# Patient Record
Sex: Male | Born: 1965 | Race: Black or African American | Hispanic: No | Smoking: Never smoker
Health system: Southern US, Community
[De-identification: ages and names within clinical notes are randomized; demographics above are authoritative.]

## PROBLEM LIST (undated history)

## (undated) DIAGNOSIS — E119 Type 2 diabetes mellitus without complications: Secondary | ICD-10-CM

## (undated) DIAGNOSIS — M79671 Pain in right foot: Secondary | ICD-10-CM

## (undated) DIAGNOSIS — M722 Plantar fascial fibromatosis: Secondary | ICD-10-CM

## (undated) DIAGNOSIS — M5416 Radiculopathy, lumbar region: Secondary | ICD-10-CM

## (undated) DIAGNOSIS — I10 Essential (primary) hypertension: Secondary | ICD-10-CM

## (undated) DIAGNOSIS — E785 Hyperlipidemia, unspecified: Secondary | ICD-10-CM

## (undated) DIAGNOSIS — S14109A Unspecified injury at unspecified level of cervical spinal cord, initial encounter: Secondary | ICD-10-CM

## (undated) DIAGNOSIS — M5412 Radiculopathy, cervical region: Secondary | ICD-10-CM

## (undated) HISTORY — DX: Pain in right foot: M79.671

## (undated) HISTORY — DX: Type 2 diabetes mellitus without complications: E11.9

## (undated) HISTORY — DX: Essential (primary) hypertension: I10

## (undated) HISTORY — DX: Plantar fascial fibromatosis: M72.2

## (undated) HISTORY — DX: Hyperlipidemia, unspecified: E78.5

## (undated) HISTORY — DX: Unspecified injury at unspecified level of cervical spinal cord, initial encounter: S14.109A

## (undated) HISTORY — DX: Radiculopathy, lumbar region: M54.16

## (undated) HISTORY — DX: Radiculopathy, cervical region: M54.12

---

## 2000-10-05 ENCOUNTER — Emergency Department (HOSPITAL_COMMUNITY): Admission: EM | Admit: 2000-10-05 | Discharge: 2000-10-05 | Payer: Self-pay | Admitting: Emergency Medicine

## 2003-05-07 ENCOUNTER — Encounter: Admission: RE | Admit: 2003-05-07 | Discharge: 2003-05-07 | Payer: Self-pay | Admitting: Family Medicine

## 2003-05-31 ENCOUNTER — Encounter: Admission: RE | Admit: 2003-05-31 | Discharge: 2003-05-31 | Payer: Self-pay | Admitting: Family Medicine

## 2004-12-26 ENCOUNTER — Emergency Department (HOSPITAL_COMMUNITY): Admission: EM | Admit: 2004-12-26 | Discharge: 2004-12-27 | Payer: Self-pay | Admitting: Emergency Medicine

## 2012-10-03 ENCOUNTER — Ambulatory Visit: Payer: Self-pay | Admitting: Internal Medicine

## 2012-10-03 VITALS — BP 134/88 | HR 103 | Temp 98.3°F | Resp 18 | Ht 68.5 in | Wt 208.0 lb

## 2012-10-03 DIAGNOSIS — Z0289 Encounter for other administrative examinations: Secondary | ICD-10-CM

## 2012-10-03 NOTE — Progress Notes (Signed)
  Subjective:    Patient ID: Logan Davis, male    DOB: [birth year], 47 y.o.   MRN: [id number]  HPI Healthy non smoker body builder, moves furniture for living. On no meds    Review of Systems neg    Objective:   Physical Exam  Vitals reviewed. Constitutional: He is oriented to person, place, and time. He appears well-developed and well-nourished.  HENT:  Right Ear: External ear normal.  Left Ear: External ear normal.  Nose: Nose normal.  Mouth/Throat: Oropharynx is clear and moist.  Eyes: Conjunctivae and EOM are normal. Pupils are equal, round, and reactive to light.  Neck: Normal range of motion. No thyromegaly present.  Cardiovascular: Normal rate and regular rhythm.   Murmur heard. Pulmonary/Chest: Effort normal and breath sounds normal. He exhibits no tenderness.  Abdominal: Soft. Bowel sounds are normal. There is no tenderness.  Genitourinary: Penis normal.  Musculoskeletal: Normal range of motion. He exhibits no edema and no tenderness.  Lymphadenopathy:    He has no cervical adenopathy.  Neurological: He is alert and oriented to person, place, and time. No cranial nerve deficit. He exhibits normal muscle tone. Coordination normal.  Skin: Skin is warm and dry. No rash noted.  Psychiatric: He has a normal mood and affect. His behavior is normal.    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

## 2012-10-03 NOTE — Patient Instructions (Signed)
Hydrocele, Adult Fluid can collect around the testicles. This fluid forms in a sac. This condition is called a hydrocele. The collected fluid causes swelling of the scrotum. Usually, it affects just one testicle. Most of the time, the condition does not cause pain. Sometimes, the hydrocele goes away on its own. Other times, surgery is needed to get rid of the fluid. CAUSES A hydrocele does not develop often. Different things can cause a hydrocele in a man, including:  Injury to the scrotum.  Infection.  X-ray of the area around the scrotum.  A tumor or cancer of the testicle.  Twisting of a testicle.  Decreased blood flow to the scrotum. SYMPTOMS   Swelling without pain. The hydrocele feels like a water-filled balloon.  Swelling with pain. This can occur if the hydrocele was caused by infection or twisting.  Mild discomfort in the scrotum.  The hydrocele may feel heavy.  Swelling that gets smaller when you lie down. DIAGNOSIS  Your caregiver will do a physical exam to decide if you have a hydrocele. This may include:  Asking questions about your overall health, today and in the past. Your caregiver may ask about any injuries, X-rays, or infections.  Pushing on your abdomen or asking you to change positions to see if the size of the hydrocele changes.  Shining a light through the scrotum (transillumination) to see if the fluid inside the scrotum is clear.  Blood tests and urine tests to check for infection.  Imaging studies that take pictures of the scrotum and testicles. TREATMENT  Treatment depends in part on what caused the condition. Options include:  Watchful waiting. Your caregiver checks the hydrocele every so often.  Different surgeries to drain the fluid.  A needle may be put into the scrotum to drain fluid (needle aspiration). Fluid often returns after this type of treatment.  A cut (incision) may be made in the scrotum to remove the fluid sac  (hydrocelectomy).  An incision may be made in the groin to repair a hydrocele that has contact with abdominal fluids (communicating hydrocele).  Medicines to treat an infection (antibiotics). HOME CARE INSTRUCTIONS  What you need to do at home may depend on the cause of the hydrocele and type of treatment. In general:  Take all medicine as directed by your caregiver. Follow the directions carefully.  Ask your caregiver if there is anything you should not do while you recover (activities, lifting, work, sex).  If you had surgery to repair a communicating hydrocele, recovery time may vary. Ask you caregiver about your recovery time.  Avoid heavy lifting for 4 to 6 weeks.  If you had an incision on the scrotum or groin, wash it for 2 to 3 days after surgery. Do this as long as the skin is closed and there are no gaps in the wound. Wash gently, and avoid rubbing the incision.  Keep all follow-up appointments. SEEK MEDICAL CARE IF:   Your scrotum seems to be getting larger.  The area becomes more and more uncomfortable. SEEK IMMEDIATE MEDICAL CARE IF:  You have a fever. Document Released: 12/02/2009 Document Revised: 09/06/2011 Document Reviewed: 12/02/2009 [facility] Patient Information 2013 [place], [place]. DASH Diet The DASH diet stands for "Dietary Approaches to Stop Hypertension." It is a healthy eating plan that has been shown to reduce high blood pressure (hypertension) in as little as 14 days, while also possibly providing other significant health benefits. These other health benefits include reducing the risk of breast cancer after menopause  and reducing the risk of type 2 diabetes, heart disease, colon cancer, and stroke. Health benefits also include weight loss and slowing kidney failure in patients with chronic kidney disease.  DIET GUIDELINES  Limit salt (sodium). Your diet should contain less than 1500 mg of sodium daily.  Limit refined or processed carbohydrates. Your diet  should include mostly whole grains. Desserts and added sugars should be used sparingly.  Include small amounts of heart-healthy fats. These types of fats include nuts, oils, and tub margarine. Limit saturated and trans fats. These fats have been shown to be harmful in the body. CHOOSING FOODS  The following food groups are based on a 2000 calorie diet. See your Registered Dietitian for individual calorie needs. Grains and Grain Products (6 to 8 servings daily)  Eat More Often: Whole-wheat bread, brown rice, whole-grain or wheat pasta, quinoa, popcorn without added fat or salt (air popped).  Eat Less Often: White bread, white pasta, white rice, cornbread. Vegetables (4 to 5 servings daily)  Eat More Often: Fresh, frozen, and canned vegetables. Vegetables may be raw, steamed, roasted, or grilled with a minimal amount of fat.  Eat Less Often/Avoid: Creamed or fried vegetables. Vegetables in a cheese sauce. Fruit (4 to 5 servings daily)  Eat More Often: All fresh, canned (in natural juice), or frozen fruits. Dried fruits without added sugar. One hundred percent fruit juice ( cup [237 mL] daily).  Eat Less Often: Dried fruits with added sugar. Canned fruit in light or heavy syrup. [employer], Fish, and Poultry (2 servings or less daily. One serving is 3 to 4 oz [85-114 g]).  Eat More Often: Ninety percent or leaner ground beef, tenderloin, sirloin. Round cuts of beef, chicken breast, [place] breast. All fish. Grill, bake, or broil your meat. Nothing should be fried.  Eat Less Often/Avoid: Fatty cuts of meat, [place], or chicken leg, thigh, or wing. Fried cuts of meat or fish. Dairy (2 to 3 servings)  Eat More Often: Low-fat or fat-free milk, low-fat plain or light yogurt, reduced-fat or part-skim cheese.  Eat Less Often/Avoid: Milk (whole, 2%).Whole milk yogurt. Full-fat cheeses. Nuts, Seeds, and Legumes (4 to 5 servings per week)  Eat More Often: All without added salt.  Eat Less  Often/Avoid: Salted nuts and seeds, canned beans with added salt. Fats and Sweets (limited)  Eat More Often: Vegetable oils, tub margarines without trans fats, sugar-free gelatin. Mayonnaise and salad dressings.  Eat Less Often/Avoid: Coconut oils, palm oils, butter, stick margarine, cream, half and half, cookies, candy, pie. FOR MORE INFORMATION The Dash Diet Eating Plan: www.dashdiet.org Document Released: 06/03/2011 Document Revised: 09/06/2011 Document Reviewed: 06/03/2011 [facility] Patient Information 2013 [place], [place]. Hypertension As your heart beats, it forces blood through your arteries. This force is your blood pressure. If the pressure is too high, it is called hypertension (HTN) or high blood pressure. HTN is dangerous because you may have it and not know it. High blood pressure may mean that your heart has to work harder to pump blood. Your arteries may be narrow or stiff. The extra work puts you at risk for heart disease, stroke, and other problems.  Blood pressure consists of two numbers, a higher number over a lower, 110/72, for example. It is stated as "110 over 72." The ideal is below 120 for the top number (systolic) and under 80 for the bottom (diastolic). Write down your blood pressure today. You should pay close attention to your blood pressure if you have certain conditions such as:  Heart failure.  Prior heart attack.  Diabetes  Chronic kidney disease.  Prior stroke.  Multiple risk factors for heart disease. To see if you have HTN, your blood pressure should be measured while you are seated with your arm held at the level of the heart. It should be measured at least twice. A one-time elevated blood pressure reading (especially in the Emergency Department) does not mean that you need treatment. There may be conditions in which the blood pressure is different between your right and left arms. It is important to see your caregiver soon for a recheck. Most people have  essential hypertension which means that there is not a specific cause. This type of high blood pressure may be lowered by changing lifestyle factors such as:  Stress.  Smoking.  Lack of exercise.  Excessive weight.  Drug/tobacco/alcohol use.  Eating less salt. Most people do not have symptoms from high blood pressure until it has caused damage to the body. Effective treatment can often prevent, delay or reduce that damage. TREATMENT  When a cause has been identified, treatment for high blood pressure is directed at the cause. There are a large number of medications to treat HTN. These fall into several categories, and your caregiver will help you select the medicines that are best for you. Medications may have side effects. You should review side effects with your caregiver. If your blood pressure stays high after you have made lifestyle changes or started on medicines,   Your medication(s) may need to be changed.  Other problems may need to be addressed.  Be certain you understand your prescriptions, and know how and when to take your medicine.  Be sure to follow up with your caregiver within the time frame advised (usually within two weeks) to have your blood pressure rechecked and to review your medications.  If you are taking more than one medicine to lower your blood pressure, make sure you know how and at what times they should be taken. Taking two medicines at the same time can result in blood pressure that is too low. SEEK IMMEDIATE MEDICAL CARE IF:  You develop a severe headache, blurred or changing vision, or confusion.  You have unusual weakness or numbness, or a faint feeling.  You have severe chest or abdominal pain, vomiting, or breathing problems. MAKE SURE YOU:   Understand these instructions.  Will watch your condition.  Will get help right away if you are not doing well or get worse. Document Released: 06/14/2005 Document Revised: 09/06/2011 Document  Reviewed: 02/02/2008 [facility] Patient Information 2013 [place], [place].

## 2012-10-14 ENCOUNTER — Encounter: Payer: Self-pay | Admitting: Internal Medicine

## 2013-01-26 NOTE — Progress Notes (Signed)
This encounter was created in error - please disregard.

## 2014-09-30 ENCOUNTER — Encounter: Payer: Self-pay | Admitting: Physician Assistant

## 2014-09-30 NOTE — Progress Notes (Signed)
This encounter was created in error - please disregard.

## 2018-06-26 DIAGNOSIS — N183 Chronic kidney disease, stage 3 (moderate): Secondary | ICD-10-CM | POA: Diagnosis not present

## 2018-06-26 DIAGNOSIS — Z794 Long term (current) use of insulin: Secondary | ICD-10-CM | POA: Diagnosis not present

## 2018-06-26 DIAGNOSIS — I10 Essential (primary) hypertension: Secondary | ICD-10-CM | POA: Diagnosis not present

## 2018-06-26 DIAGNOSIS — E785 Hyperlipidemia, unspecified: Secondary | ICD-10-CM | POA: Diagnosis not present

## 2018-06-26 DIAGNOSIS — I129 Hypertensive chronic kidney disease with stage 1 through stage 4 chronic kidney disease, or unspecified chronic kidney disease: Secondary | ICD-10-CM | POA: Diagnosis not present

## 2018-06-26 DIAGNOSIS — Z23 Encounter for immunization: Secondary | ICD-10-CM | POA: Diagnosis not present

## 2018-06-26 DIAGNOSIS — E1165 Type 2 diabetes mellitus with hyperglycemia: Secondary | ICD-10-CM | POA: Diagnosis not present

## 2018-07-17 ENCOUNTER — Encounter: Payer: Self-pay | Admitting: Endocrinology

## 2018-09-11 ENCOUNTER — Encounter: Payer: Self-pay | Admitting: Internal Medicine

## 2018-09-11 ENCOUNTER — Ambulatory Visit: Payer: BLUE CROSS/BLUE SHIELD | Admitting: Internal Medicine

## 2018-09-11 ENCOUNTER — Other Ambulatory Visit: Payer: Self-pay

## 2018-09-11 VITALS — BP 156/92 | HR 101 | Ht 67.0 in | Wt 206.6 lb

## 2018-09-11 DIAGNOSIS — E1165 Type 2 diabetes mellitus with hyperglycemia: Secondary | ICD-10-CM | POA: Diagnosis not present

## 2018-09-11 DIAGNOSIS — I10 Essential (primary) hypertension: Secondary | ICD-10-CM | POA: Diagnosis not present

## 2018-09-11 DIAGNOSIS — Z1211 Encounter for screening for malignant neoplasm of colon: Secondary | ICD-10-CM | POA: Diagnosis not present

## 2018-09-11 DIAGNOSIS — Z794 Long term (current) use of insulin: Secondary | ICD-10-CM | POA: Diagnosis not present

## 2018-09-11 DIAGNOSIS — R202 Paresthesia of skin: Secondary | ICD-10-CM | POA: Diagnosis not present

## 2018-09-11 LAB — GLUCOSE, POCT (MANUAL RESULT ENTRY): POC Glucose: 178 mg/dl — AB (ref 70–99)

## 2018-09-11 MED ORDER — ONETOUCH VERIO VI STRP: ORAL_STRIP | 6 refills | Status: DC

## 2018-09-11 MED ORDER — METFORMIN HCL ER 500 MG PO TB24: 500.0000 mg | ORAL_TABLET | Freq: Every day | ORAL | 6 refills | Status: DC

## 2018-09-11 NOTE — Progress Notes (Signed)
Name: Logan Davis  MRN/ DOB: [id number], [birth year]   Age/ Sex: 53 y.o., male    PCP: Bryon Lions, PA-C   Reason for Endocrinology Evaluation: Type 2 Diabetes Mellitus     Date of Initial Endocrinology Visit: 09/11/2018     PATIENT IDENTIFIER: Logan Davis is a 53 y.o. male with a past medical history of HTN, Dyslipidemia, and T2DM. The patient presented for initial endocrinology clinic visit on 09/11/2018 for consultative assistance with his diabetes management.    HPI: Logan Davis is here with his wife   Diagnosed with T2DM in 2018 Prior Medications tried/Intolerance: Metformin- intolerance.  Currently checking blood sugars 0 x / day Hypoglycemia episodes : no    Hemoglobin A1c has ranged from 8.7% in 2019, peaking at > 15.5% in 2018. Patient required assistance for hypoglycemia: no Patient has required hospitalization within the last 1 year from hyper or hypoglycemia:  No   In terms of diet, the patient drinks a lot of soft drinks and sugar-sweetened beverages, snacks a lot as well, patient consumes bodybuilding supplements that are high in CHO.    HOME DIABETES REGIMEN: Farxiga 5 mg daily - stopped a month ago  Lantus 14 units daily- skins 3-4 times a week   Victoza 1.8 mg daily     Statin: yes ACE-I/ARB: yes Prior Diabetic Education: no   METER DOWNLOAD SUMMARY: Did not bring    DIABETIC COMPLICATIONS: Microvascular complications:    Denies: neuropathy, retinopathy  Last eye exam: Completed 02/2018  Macrovascular complications:    Denies: CAD, PVD, CVA   PAST HISTORY:  Past Medical History: No past medical history on file.  Past Surgical History: No past surgical history on file.   Social History:  reports that he has never smoked. He has never used smokeless tobacco. He reports that he does not drink alcohol or use drugs. Family History:  Family History  Problem Relation Age of Onset  . Diabetes Mother   . Hypertension Mother   .  Hypertension Father   . Diabetes Father   . Diabetes Sister   . Hypertension Sister   . Hyperlipidemia Sister      HOME MEDICATIONS: Allergies as of 09/11/2018      Reactions   Sulfa Antibiotics Anaphylaxis      Medication List       Accurate as of September 11, 2018 12:39 PM. Always use your most recent med list.        amLODipine 10 MG tablet Commonly known as:  NORVASC Take by mouth.   BD Pen Needle Nano U/F 32G X 4 MM Misc Generic drug:  Insulin Pen Needle USE UTD WITH INSULIN AND VICTOZA D   Lantus SoloStar 100 UNIT/ML Solostar Pen Generic drug:  Insulin Glargine Inject 20 Units into the skin daily.   LIFESCAN FINEPOINT LANCETS Misc Use to check blood sugar 3 time(s) daily   losartan 50 MG tablet Commonly known as:  COZAAR TAKE 1 TABLET(50 MG) BY MOUTH DAILY   metFORMIN 500 MG 24 hr tablet Commonly known as:  GLUCOPHAGE-XR Take 1 tablet (500 mg total) by mouth daily with breakfast.   OneTouch Verio test strip Generic drug:  glucose blood USE UTD TO TEST BS BID   simvastatin 20 MG tablet Commonly known as:  ZOCOR Take by mouth.   Victoza 18 MG/3ML Sopn Generic drug:  liraglutide INJECT 0.6 MG SUBCUTANEOUSLY D FOR 1 WEEK THEN 1.2 MG D FOR 1 WEEK THEN 1.8 MG D  AND CONTINUE        ALLERGIES: Allergies  Allergen Reactions  . Sulfa Antibiotics Anaphylaxis     REVIEW OF SYSTEMS: A comprehensive ROS was conducted with the patient and is negative except as per HPI and below:  Review of Systems  Constitutional: Negative for malaise/fatigue.  HENT: Negative for congestion and sore throat.   Respiratory: Negative for cough and shortness of breath.   Cardiovascular: Negative for chest pain and palpitations.  Genitourinary: Negative for frequency.  Neurological: Positive for tingling. Negative for tremors.  Endo/Heme/Allergies: Negative for polydipsia.  Psychiatric/Behavioral: Negative for depression. The patient is nervous/anxious.       OBJECTIVE:    VITAL SIGNS: BP (!) 156/92 (BP Location: Left Arm, Patient Position: Sitting, Cuff Size: Normal)   Pulse (!) 101   Ht 5\' 7"  (1.702 m)   Wt 206 lb 9.6 oz (93.7 kg)   SpO2 98%   BMI 32.36 kg/m    PHYSICAL EXAM:  General: Pt appears well and is in NAD  Hydration: Well-hydrated with moist mucous membranes and good skin turgor  HEENT: Head: Unremarkable with good dentition. Oropharynx clear without exudate.  Eyes: External eye exam normal without stare, lid lag or exophthalmos.  EOM intact.    Neck: General: Supple without adenopathy or carotid bruits. Thyroid: Thyroid size normal.  No goiter or nodules appreciated. No thyroid bruit.  Lungs: Clear with good BS bilat with no rales, rhonchi, or wheezes  Heart: RRR with normal S1 and S2 and no gallops; no murmurs; no rub  Abdomen: Normoactive bowel sounds, soft, nontender, without masses or organomegaly palpable  Extremities:  Lower extremities - Trace pretibial edema.   Skin: Normal texture and temperature to palpation. No rash noted. No Acanthosis nigricans/skin tags. No lipohypertrophy.  Neuro: MS is good with appropriate affect, pt is alert and Ox3    DM foot exam: deferred   DATA REVIEWED: 06/26/2018  BUN/Cr 16/1.44 mg/dL GFR 65 LDL 497 mg/dL  N3Y 05.1 %        ASSESSMENT / PLAN / RECOMMENDATIONS:   1) Type 2 Diabetes Mellitus, poorly controlled, Without complications - Most recent A1c of 13.0 %. Goal A1c < 7.0 %.    Plan: GENERAL: I have discussed with the patient the pathophysiology of diabetes. We went over the natural progression of the disease. We talked about both insulin resistance and insulin deficiency. We stressed the importance of lifestyle changes including diet and exercise. I explained the complications associated with diabetes including retinopathy, nephropathy, neuropathy as well as increased risk of cardiovascular disease. We went over the benefit seen with glycemic control.  I explained to the patient  that diabetic patients are at higher than normal risk for amputations. The patient was informed that diabetes is the number one cause of non-traumatic amputations in [place].   We have discussed avoiding sugar sweetened beverages, we have discussed finding low-carb alternatives to his supplements, patient under the impression that his fatigue improves with high carb supplements.  His poorly controlled diabetes is due to medication nonadherence and continued dietary indiscretions.  I am wondering if farxiga  is causing fatigue due to the risk of dehydration.  Despite his prior intolerance to metformin, we will restart him on extended release as they have better tolerance, and add a small dose.  We also have discussed the importance of glucose checks, and the importance of having those readings available for proper diabetes management.   MEDICATIONS:  Stop [facility] metformin 500 mg XR  with breakfast  Increase Lantus to 20 units daily  Continue Victoza at 1.8 mg daily  EDUCATION / INSTRUCTIONS:  BG monitoring instructions: Patient is instructed to check his blood sugars 2 times a day, fasting and bedtime.  Call [facility] if: BG persistently < 70 or > 300. . I reviewed the Rule of 15 for the treatment of hypoglycemia in detail with the patient. Literature supplied.   2) Diabetic complications:   Eye: Does not have known diabetic retinopathy.   Neuro/ Feet: Does night have known diabetic peripheral neuropathy.  Renal: Patient does not have known baseline CKD. He is  on an ACEI/ARB at present.   3) Lipids: Patient is  on a statin.  LDL above goal, patient admits to noncompliance with statin use in the past.   4) Hypertension: He is above goal of < 140/90 mmHg.  Will defer further management to PCP  Follow-up in 6 weeks      Signed electronically by: Lyndle Herrlich, MD  [facility]  [facility] [address]  [address] [place], [place] [address] Phone: [phone] FAX: [phone]   CC: Bryon Lions, PA-C [address] [place] [address] Phone: [phone]  Fax: [phone]    Return to Endocrinology clinic as below: Future Appointments  Date Time Provider Department Center  10/23/2018  8:30 AM Kriss Ishler, Konrad Dolores, MD [facility] None

## 2018-09-11 NOTE — Patient Instructions (Signed)
-   Stop Farxiga - Start Metformin 500 mg XR with Breakfast  - Increase Lantus to 20 units daily - Continue Victoza 1.8 mg daily     - Choose healthy, lower carb lower calorie snacks: toss salad, cooked vegetables, cottage cheese, peanut butter, low fat cheese / string cheese, lower sodium deli meat, tuna salad or chicken salad   - Check sugars fasting and bedtime   - HOW TO TREAT LOW BLOOD SUGARS (Blood sugar LESS THAN 70 MG/DL)  Please follow the RULE OF 15 for the treatment of hypoglycemia treatment (when your (blood sugars are less than 70 mg/dL)    STEP 1: Take 15 grams of carbohydrates when your blood sugar is low, which includes:   3-4 GLUCOSE TABS  OR  3-4 OZ OF JUICE OR REGULAR SODA OR  ONE TUBE OF GLUCOSE GEL     STEP 2: RECHECK blood sugar in 15 MINUTES STEP 3: If your blood sugar is still low at the 15 minute recheck --> then, go back to STEP 1 and treat AGAIN with another 15 grams of carbohydrates.

## 2018-09-27 DIAGNOSIS — I10 Essential (primary) hypertension: Secondary | ICD-10-CM | POA: Diagnosis not present

## 2018-09-27 DIAGNOSIS — G540 Brachial plexus disorders: Secondary | ICD-10-CM | POA: Diagnosis not present

## 2018-09-27 DIAGNOSIS — G5711 Meralgia paresthetica, right lower limb: Secondary | ICD-10-CM | POA: Diagnosis not present

## 2018-09-27 DIAGNOSIS — M5412 Radiculopathy, cervical region: Secondary | ICD-10-CM | POA: Diagnosis not present

## 2018-10-18 DIAGNOSIS — E1165 Type 2 diabetes mellitus with hyperglycemia: Secondary | ICD-10-CM | POA: Diagnosis not present

## 2018-10-18 DIAGNOSIS — Z794 Long term (current) use of insulin: Secondary | ICD-10-CM | POA: Diagnosis not present

## 2018-10-23 ENCOUNTER — Ambulatory Visit (INDEPENDENT_AMBULATORY_CARE_PROVIDER_SITE_OTHER): Payer: BLUE CROSS/BLUE SHIELD | Admitting: Internal Medicine

## 2018-10-23 ENCOUNTER — Other Ambulatory Visit: Payer: Self-pay

## 2018-10-23 ENCOUNTER — Encounter: Payer: Self-pay | Admitting: Internal Medicine

## 2018-10-23 DIAGNOSIS — E1165 Type 2 diabetes mellitus with hyperglycemia: Secondary | ICD-10-CM | POA: Diagnosis not present

## 2018-10-23 DIAGNOSIS — I129 Hypertensive chronic kidney disease with stage 1 through stage 4 chronic kidney disease, or unspecified chronic kidney disease: Secondary | ICD-10-CM | POA: Diagnosis not present

## 2018-10-23 DIAGNOSIS — N183 Chronic kidney disease, stage 3 (moderate): Secondary | ICD-10-CM | POA: Diagnosis not present

## 2018-10-23 DIAGNOSIS — E1122 Type 2 diabetes mellitus with diabetic chronic kidney disease: Secondary | ICD-10-CM | POA: Diagnosis not present

## 2018-10-23 DIAGNOSIS — Z794 Long term (current) use of insulin: Secondary | ICD-10-CM

## 2018-10-23 NOTE — Progress Notes (Signed)
Virtual Visit via Video Note  I connected with Pearson Grippe on 10/23/18 at  8:30 AM EDT by a video enabled telemedicine application and verified that I am speaking with the correct person using two identifiers.   I discussed the limitations of evaluation and management by telemedicine and the availability of in person appointments. The patient expressed understanding and agreed to proceed.   -Location of the patient : Home  -Location of the provider : Office -The names of all persons participating in the telemedicine service : Pt and myself , Wife        Name: Logan Davis  Age/ Sex: 53 y.o., male   MRN/ DOB: [id number], 09/11/1965     PCP: Rosemary Holms   Reason for Endocrinology Evaluation: Type 2 Diabetes Mellitus     Initial Endocrinology Clinic Visit: 09/11/2018    PATIENT IDENTIFIER: [name] is a 53 y.o. male with a past medical history of HTN, dyslipidemia and T2DM. The patient has followed with Endocrinology clinic since 09/11/2018 for consultative assistance with management of his diabetes.  DIABETIC HISTORY:  Mr. Brickey was diagnosed with T2DM in  2018. He has reported intolerance to Metformin. He has tried [place] in the past without reported intolerance. Insulin has been started shortly after diagnosis.  His hemoglobin A1c has ranged from 8.7% in 2019, peaking at > 15.5% in 2018.   On his initial visit to our clinic his A1c was 13.0 %. He was on Farxiga (but had stopped it a month prior to his presentation),so we stopped it,  he was skipping lantus ~ 3-4 x a week . He was also on Victoza.  SUBJECTIVE:   During the last visit (09/11/18): His A1c was 13.0 %. He was on Farxiga (but had stopped it a month prior to his presentations. We statred Metformin XR , we increased Lantus and continued Victoza.   Today (10/23/2018): Mr. [name] is here for a 6 week virtual follow up on his diabetes management.  He checks his blood sugars 1 times daily,  preprandial to breakfast . The patient has not had hypoglycemic episodes since the last clinic visit, he did however notice BG's in the 80's and 90's mg/dL. Otherwise, the patient has not required any recent emergency interventions for hypoglycemia and has not had recent hospitalizations secondary to hyper or hypoglycemic episodes.    ROS: As per HPI and as detailed below: Review of Systems  Constitutional: Negative for fever.  HENT: Negative for congestion and sore throat.   Cardiovascular: Negative for chest pain and palpitations.  Gastrointestinal: Negative for diarrhea and nausea.      HOME DIABETES REGIMEN:   Metformin 500 mg XR with breakfast  Lantus to 20 units daily- taking 22 units   Victoza at 1.8 mg daily     GLUCOSE LOG:  date Fasting  10/23/18 130  4/26 122  4/25 102  4/24 125  4/23 106  4/22 118  4/21 116      HISTORY:  Past Medical History:  Past Medical History:  Diagnosis Date  . Diabetes mellitus without complication (HCC)   . Hyperlipidemia   . Hypertension     Past Surgical History: No past surgical history on file.  Social History:  reports that he has never smoked. He has never used smokeless tobacco. He reports that he does not drink alcohol or use drugs. Family History:  Family History  Problem Relation Age of Onset  . Diabetes Mother   .  Hypertension Mother   . Hypertension Father   . Diabetes Father   . Diabetes Sister   . Hypertension Sister   . Hyperlipidemia Sister      HOME MEDICATIONS: Allergies as of 10/23/2018      Reactions   Sulfa Antibiotics Anaphylaxis      Medication List       Accurate as of October 23, 2018  8:40 AM. Always use your most recent med list.        amLODipine 10 MG tablet Commonly known as:  NORVASC Take by mouth.   BD Pen Needle Nano U/F 32G X 4 MM Misc Generic drug:  Insulin Pen Needle USE UTD WITH INSULIN AND VICTOZA D   Lantus SoloStar 100 UNIT/ML Solostar Pen Generic drug:   Insulin Glargine Inject 20 Units into the skin daily.   LIFESCAN FINEPOINT LANCETS Misc Use to check blood sugar 3 time(s) daily   losartan 50 MG tablet Commonly known as:  COZAAR TAKE 1 TABLET(50 MG) BY MOUTH DAILY   metFORMIN 500 MG 24 hr tablet Commonly known as:  GLUCOPHAGE-XR Take 1 tablet (500 mg total) by mouth daily with breakfast.   OneTouch Verio test strip Generic drug:  glucose blood Twice daily   simvastatin 20 MG tablet Commonly known as:  ZOCOR Take by mouth.   Victoza 18 MG/3ML Sopn Generic drug:  liraglutide INJECT 0.6 MG SUBCUTANEOUSLY D FOR 1 WEEK THEN 1.2 MG D FOR 1 WEEK THEN 1.8 MG D AND CONTINUE         DATA REVIEWED:    n/a   ASSESSMENT / PLAN / RECOMMENDATIONS:   1) Type 2 Diabetes Mellitus, Poorly controlled, Without  complications - Most recent A1c of 13.0 %. Goal A1c < 7.0%.   Plan: - Praised the patient on medication adherence and glucose checks.  - I have encouraged him to avoid sugar-sweetened beverages.  - Somehow he was taking 22 units of Lantus instead of the recommended 20 units daily, due to reported BG's in the 80's and 90's with the increase in metformin dose, will reduce lantus dose as below - He is tolerating the XR Metformin dose, will increase as below    MEDICATIONS:  Increase Metformin 500 mg XR to BID with meals  Decrease Lantus to 18 units daily   Continue Victoza 1.8 mg daily   EDUCATION / INSTRUCTIONS:  BG monitoring instructions: Patient is instructed to check his blood sugars 1 times a day, fasting.  Call [facility] clinic if: BG persistently < 70 or > 300. . I reviewed the Rule of 15 for the treatment of hypoglycemia in detail with the patient. Literature supplied.        I discussed the assessment and treatment plan with the patient. The patient was provided an opportunity to ask questions and all were answered. The patient agreed with the plan and demonstrated an understanding of the  instructions.   The patient was advised to call back or seek an in-person evaluation if the symptoms worsen or if the condition fails to improve as anticipated.     F/U in 3 months    Signed electronically by: Lyndle HerrlichAbby Jaralla Kennisha Qin, MD  [facility]  [facility] [address] [place], [place] [address] Phone: [phone] FAX: [phone]   CC: Bryon LionsMoreira, Niall A, PA-C [address] [place] [address] Phone: [phone]  Fax: [phone]  Return to Endocrinology clinic as below: Future Appointments  Date Time Provider  Department Center  11/23/2018  9:00 AM Jobe, Rolin Barry, RD [facility]

## 2018-10-24 DIAGNOSIS — M5412 Radiculopathy, cervical region: Secondary | ICD-10-CM | POA: Diagnosis not present

## 2018-10-24 DIAGNOSIS — G5711 Meralgia paresthetica, right lower limb: Secondary | ICD-10-CM | POA: Diagnosis not present

## 2018-10-24 DIAGNOSIS — G540 Brachial plexus disorders: Secondary | ICD-10-CM | POA: Diagnosis not present

## 2018-11-01 DIAGNOSIS — M5412 Radiculopathy, cervical region: Secondary | ICD-10-CM | POA: Diagnosis not present

## 2018-11-13 DIAGNOSIS — M5412 Radiculopathy, cervical region: Secondary | ICD-10-CM | POA: Diagnosis not present

## 2018-11-13 DIAGNOSIS — G5601 Carpal tunnel syndrome, right upper limb: Secondary | ICD-10-CM | POA: Diagnosis not present

## 2018-11-13 DIAGNOSIS — G5711 Meralgia paresthetica, right lower limb: Secondary | ICD-10-CM | POA: Diagnosis not present

## 2018-11-21 ENCOUNTER — Telehealth: Payer: Self-pay | Admitting: Internal Medicine

## 2018-11-21 ENCOUNTER — Other Ambulatory Visit: Payer: Self-pay | Admitting: Unknown Physician Specialty

## 2018-11-21 DIAGNOSIS — M5412 Radiculopathy, cervical region: Secondary | ICD-10-CM

## 2018-11-21 NOTE — Telephone Encounter (Signed)
Pt stated that he understands instructions

## 2018-11-21 NOTE — Telephone Encounter (Signed)
Pt stated that recent blood sugars have been 11/21/18 163 a.m. 223 @ 10:45 a.m. 11/20/18 153 a.m. 11/19/18 146 a.m.  11/18/18 123 a.m. pt is currently yaking metformin 500 mg once daily, victoza 0.6mg , lantus 20 units and pt was recently placed on HCTZ 12.5 mg, please advise.

## 2018-11-21 NOTE — Telephone Encounter (Signed)
Patient called to advise that he is concerned about his blood sugars.  They have been elevated consistently for the last 5-6 days  Call back number [phone] .

## 2018-11-21 NOTE — Telephone Encounter (Signed)
lft vm for pt to return call to get recent blood sugar readings

## 2018-11-21 NOTE — Telephone Encounter (Signed)
Patient returned call regarding blood sugars

## 2018-11-22 ENCOUNTER — Ambulatory Visit
Admission: RE | Admit: 2018-11-22 | Discharge: 2018-11-22 | Disposition: A | Discharge status: Home / Self Care | Payer: BLUE CROSS/BLUE SHIELD | Source: Ambulatory Visit | Attending: Unknown Physician Specialty | Admitting: Unknown Physician Specialty

## 2018-11-22 DIAGNOSIS — M4802 Spinal stenosis, cervical region: Secondary | ICD-10-CM | POA: Diagnosis not present

## 2018-11-22 DIAGNOSIS — M5412 Radiculopathy, cervical region: Secondary | ICD-10-CM

## 2018-11-22 DIAGNOSIS — M4722 Other spondylosis with radiculopathy, cervical region: Secondary | ICD-10-CM | POA: Diagnosis not present

## 2018-11-23 ENCOUNTER — Ambulatory Visit: Payer: BLUE CROSS/BLUE SHIELD | Admitting: Dietician

## 2018-11-28 ENCOUNTER — Ambulatory Visit
Admission: RE | Admit: 2018-11-28 | Discharge: 2018-11-28 | Disposition: A | Discharge status: Home / Self Care | Payer: BLUE CROSS/BLUE SHIELD | Source: Ambulatory Visit | Attending: Unknown Physician Specialty | Admitting: Unknown Physician Specialty

## 2018-11-28 ENCOUNTER — Other Ambulatory Visit: Payer: Self-pay

## 2018-11-28 ENCOUNTER — Other Ambulatory Visit: Payer: Self-pay | Admitting: Unknown Physician Specialty

## 2018-11-28 DIAGNOSIS — M5412 Radiculopathy, cervical region: Secondary | ICD-10-CM

## 2018-11-28 DIAGNOSIS — M4802 Spinal stenosis, cervical region: Secondary | ICD-10-CM | POA: Diagnosis not present

## 2018-12-04 ENCOUNTER — Other Ambulatory Visit: Payer: Self-pay

## 2018-12-05 ENCOUNTER — Encounter: Payer: Self-pay | Admitting: Internal Medicine

## 2018-12-05 ENCOUNTER — Other Ambulatory Visit: Payer: Self-pay

## 2018-12-05 ENCOUNTER — Ambulatory Visit: Payer: BC Managed Care – PPO | Admitting: Internal Medicine

## 2018-12-05 VITALS — BP 132/94 | HR 96 | Temp 98.4°F | Ht 67.0 in | Wt 206.8 lb

## 2018-12-05 DIAGNOSIS — Z794 Long term (current) use of insulin: Secondary | ICD-10-CM | POA: Diagnosis not present

## 2018-12-05 DIAGNOSIS — E1122 Type 2 diabetes mellitus with diabetic chronic kidney disease: Secondary | ICD-10-CM | POA: Diagnosis not present

## 2018-12-05 DIAGNOSIS — E785 Hyperlipidemia, unspecified: Secondary | ICD-10-CM | POA: Diagnosis not present

## 2018-12-05 DIAGNOSIS — E1165 Type 2 diabetes mellitus with hyperglycemia: Secondary | ICD-10-CM | POA: Diagnosis not present

## 2018-12-05 DIAGNOSIS — I10 Essential (primary) hypertension: Secondary | ICD-10-CM | POA: Diagnosis not present

## 2018-12-05 DIAGNOSIS — E119 Type 2 diabetes mellitus without complications: Secondary | ICD-10-CM | POA: Diagnosis not present

## 2018-12-05 DIAGNOSIS — N183 Chronic kidney disease, stage 3 (moderate): Secondary | ICD-10-CM | POA: Diagnosis not present

## 2018-12-05 LAB — BASIC METABOLIC PANEL
BUN: 19 mg/dL (ref 6–23)
CO2: 29 mEq/L (ref 19–32)
Calcium: 10.2 mg/dL (ref 8.4–10.5)
Chloride: 101 mEq/L (ref 96–112)
Creatinine, Ser: 1.28 mg/dL (ref 0.40–1.50)
GFR: 71.53 mL/min (ref >60.00)
Glucose, Bld: 161 mg/dL — ABNORMAL HIGH (ref 70–99)
Potassium: 3.9 mEq/L (ref 3.5–5.1)
Sodium: 136 mEq/L (ref 135–145)

## 2018-12-05 LAB — LIPID PANEL
Cholesterol: 176 mg/dL (ref 0–200)
HDL: 52.5 mg/dL (ref >39.00)
LDL Cholesterol: 106 mg/dL — ABNORMAL HIGH (ref 0–99)
NonHDL: 123.23
Total CHOL/HDL Ratio: 3
Triglycerides: 87 mg/dL (ref 0.0–149.0)
VLDL: 17.4 mg/dL (ref 0.0–40.0)

## 2018-12-05 LAB — POCT GLYCOSYLATED HEMOGLOBIN (HGB A1C): Hemoglobin A1C: 7.8 % — AB (ref 4.0–5.6)

## 2018-12-05 MED ORDER — ATORVASTATIN CALCIUM 20 MG PO TABS: 20.0000 mg | ORAL_TABLET | Freq: Every day | ORAL | 3 refills | Status: DC

## 2018-12-05 MED ORDER — DAPAGLIFLOZIN PROPANEDIOL 10 MG PO TABS: 10.0000 mg | ORAL_TABLET | Freq: Every day | ORAL | 11 refills | Status: DC

## 2018-12-05 MED ORDER — DULAGLUTIDE 1.5 MG/0.5ML ~~LOC~~ SOAJ
1.5000 mg | SUBCUTANEOUS | 11 refills | Status: DC
Start: 2018-12-05 — End: 2019-03-12

## 2018-12-05 MED ORDER — METFORMIN HCL ER 500 MG PO TB24: 1000.0000 mg | ORAL_TABLET | Freq: Two times a day (BID) | ORAL | 6 refills | Status: DC

## 2018-12-05 NOTE — Progress Notes (Addendum)
Name: Logan Davis Logan Davis  Age/ Sex: 53 y.o., male   MRN/ DOB: [id number], 1965-09-14     PCP: Rosemary HolmsMoreira, Niall A, PA-C   Reason for Endocrinology Evaluation: Type 2 Diabetes Mellitus     Initial Endocrinology Clinic Visit: 09/11/2018    PATIENT IDENTIFIER: [name] Logan Davis is a 53 y.o. male with a past medical history of HTN, dyslipidemia and T2DM. The patient has followed with Endocrinology clinic since 09/11/2018 for consultative assistance with management of his diabetes.  DIABETIC HISTORY:  [name] was diagnosed with T2DM in  2018. He has reported intolerance to Metformin. He has tried [place] in the past without reported intolerance. Insulin has been started shortly after diagnosis.  His hemoglobin A1c has ranged from 8.7% in 2019, peaking at > 15.5% in 2018.   On his initial visit to our clinic his A1c was 13.0 %. He was on Farxiga (but had stopped it a month prior to his presentation),so we stopped it,  he was skipping lantus ~ 3-4 x a week . He was also on Victoza.  SUBJECTIVE:   During the last visit (10/23/18): We increased Metformin, decreased Lantus and continued victoza.  Today (12/05/2018): [name] is here for a 6 week follow up on his diabetes management.  He checks his blood sugars 1 times daily, preprandial to breakfast . The patient has not had hypoglycemic episodes since the last clinic visit. 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 Otherwise, the patient has not required any recent emergency interventions for hypoglycemia and has not had recent hospitalizations secondary to hyper or hypoglycemic episodes.    ROS: As per HPI and as detailed below: Review of Systems  Constitutional: Negative for fever.  HENT: Negative for congestion and sore throat.   Cardiovascular: Negative for chest pain and palpitations.  Gastrointestinal: Negative for diarrhea and nausea.      HOME  DIABETES REGIMEN:   Metformin 500 mg XR 1 tab with breakfast and 1 at night   Lantus 20 units daily   Victoza at 1.8 mg daily    METER DOWNLOAD SUMMARY: 4/27-5/26 Fingerstick Blood Glucose Tests = 29 Overall Mean FS Glucose = 138.2 Standard Deviation = 29.6  BG Ranges: Low = 86 High = 230  Hypoglycemic Events/30 Days: BG < 50 = 0 Episodes of symptomatic severe hypoglycemia = 0    HISTORY:  Past Medical History:  Past Medical History:  Diagnosis Date  . Diabetes mellitus without complication (HCC)   . Hyperlipidemia   . Hypertension     Past Surgical History: No past surgical history on file.  Social History:  reports that he has never smoked. He has never used smokeless tobacco. He reports that he does not drink alcohol or use drugs. Family History:  Family History  Problem Relation Age of Onset  . Diabetes Mother   . Hypertension Mother   . Hypertension Father   . Diabetes Father   . Diabetes Sister   . Hypertension Sister   . Hyperlipidemia Sister      HOME MEDICATIONS: Allergies as of 12/05/2018      Reactions   Sulfa Antibiotics Anaphylaxis      Medication List       Accurate as of December 05, 2018  9:29 AM. If you have any questions, ask your nurse or doctor.  amLODipine 10 MG tablet Commonly known as:  NORVASC Take by mouth.   BD Pen Needle Nano U/F 32G X 4 MM Misc Generic drug:  Insulin Pen Needle USE UTD WITH INSULIN AND VICTOZA D   Lantus SoloStar 100 UNIT/ML Solostar Pen Generic drug:  Insulin Glargine Inject 20 Units into the skin daily.   LIFESCAN FINEPOINT LANCETS Misc Use to check blood sugar 3 time(s) daily   losartan 50 MG tablet Commonly known as:  COZAAR 100 mg.   metFORMIN 500 MG 24 hr tablet Commonly known as:  GLUCOPHAGE-XR Take 1 tablet (500 mg total) by mouth daily with breakfast. What changed:  additional instructions   OneTouch Verio test strip Generic drug:  glucose blood Twice daily   simvastatin 20  MG tablet Commonly known as:  ZOCOR Take by mouth.   Victoza 18 MG/3ML Sopn Generic drug:  liraglutide INJECT 0.6 MG SUBCUTANEOUSLY D FOR 1 WEEK THEN 1.2 MG D FOR 1 WEEK THEN 1.8 MG D AND CONTINUE         DATA REVIEWED: 06/26/2018  BUN/Cr 16/1.44 mg/dL GFR 65 LDL 150 mg/dL  A1c 13.0 %      Results for Logan Davis (MRN [id number]) as of 12/05/2018 14:37  Ref. Range 12/05/2018 10:32  Sodium Latest Ref Range: 135 - 145 mEq/Davis 136  Potassium Latest Ref Range: 3.5 - 5.1 mEq/Davis 3.9  Chloride Latest Ref Range: 96 - 112 mEq/Davis 101  CO2 Latest Ref Range: 19 - 32 mEq/Davis 29  Glucose Latest Ref Range: 70 - 99 mg/dL 161 (H)  BUN Latest Ref Range: 6 - 23 mg/dL 19  Creatinine Latest Ref Range: 0.40 - 1.50 mg/dL 1.28  Calcium Latest Ref Range: 8.4 - 10.5 mg/dL 10.2  GFR Latest Ref Range: >60.00 mL/min 71.53  Total CHOL/HDL Ratio Unknown 3  Cholesterol Latest Ref Range: 0 - 200 mg/dL 176  HDL Cholesterol Latest Ref Range: >39.00 mg/dL 52.50  LDL (calc) Latest Ref Range: 0 - 99 mg/dL 106 (H)  NonHDL Unknown 123.23  Triglycerides Latest Ref Range: 0.0 - 149.0 mg/dL 87.0  VLDL Latest Ref Range: 0.0 - 40.0 mg/dL 17.4    ASSESSMENT / PLAN / RECOMMENDATIONS:   1) Type 2 Diabetes Mellitus, Sub-Optimally Controlled, Without  complications - Most recent A1c of 7.8 %. Goal A1c < 7.0%. Down from 13.0%  Plan: - Praised the patient on improved glycemic control. 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 I reassured him and printed out the federal [employer] announcement from 2018.  - I have encouraged him to avoid sugar-sweetened beverages, we discussed ways of cutting down on his carbs while on the road, I have offered him a referral to see our CDE.  - He is tolerating the XR Metformin dose, will increase as  below  - He would like to switch victoza to a weekly alternative such as Trulicity  - He would like to avoid using insulin, and try other alternatives, he would like to [place], he did not have any side effects to it in the past, discussed CV benefits, as well as weight loss and glucose control but we also discussed risk of genital infections.  - I have encouraged him to check his glucose twice a day and to contact me with BG's >  200 or should he start having any nausea/diarrhea.    MEDICATIONS:  Increase Metformin 500 mg XR to 2 tabs BID - titration provided   Stop Lantus   Stop Victoza  Start Farxiga 10 mg daily   Star Trulicity 1.5 mg weekly   EDUCATION / INSTRUCTIONS:  BG monitoring instructions: Patient is instructed to check his blood sugars 2 times a day, fasting and bedtime  Call  Endocrinology clinic if: BG persistently < 70 or > 300. . I reviewed the Rule of 15 for the treatment of hypoglycemia in detail with the patient. Literature supplied.    2) Diabetic complications:   Eye: Does not have known diabetic retinopathy.   Neuro/ Feet: Does not have known diabetic peripheral neuropathy.  Renal: Patient does not have known baseline CKD. He is  on an ACEI/ARB at present.   3) Lipids: Patient is on a statin. Previously non-compliant and LDL has been above goal.  Discussed CV benefits of statins. Will stop Simvastatin and start Atorvastatin at 20 mg daily    4) Hypertension: He is above goal of < 140/90 mmHg. Pt non-compliant with medications, encouraged  Compliance with anti-hypertensive meds.     F/U in 3 months    Signed electronically by: Lyndle HerrlichAbby Jaralla Shamleffer, MD  [facility]  [facility] [address] [place], [place] [address] Phone: [phone] FAX: [phone]   CC: Bryon LionsMoreira, Niall A, PA-C [address] [place] [address] Phone: [phone]  Fax: [phone]   Return to Endocrinology clinic as below: No future appointments.

## 2018-12-05 NOTE — Patient Instructions (Addendum)
-   Stop Victoza  - Stop Lantus  -  Increase Metformin to 2 tablets with Breakfast , 1 tablet with Supper for 1 week, then increase to 2 tablets with Breakfast and 2 tablets with Supper after that.  - Start Trulicity 1.5 mg weekly   - Check sugar twice a day (when you wake up and at bedtime)      HOW TO TREAT LOW BLOOD SUGARS (Blood sugar LESS THAN 70 MG/DL)  Please follow the RULE OF 15 for the treatment of hypoglycemia treatment (when your (blood sugars are less than 70 mg/dL)    STEP 1: Take 15 grams of carbohydrates when your blood sugar is low, which includes:   3-4 GLUCOSE TABS  OR  3-4 OZ OF JUICE OR REGULAR SODA OR  ONE TUBE OF GLUCOSE GEL     STEP 2: RECHECK blood sugar in 15 MINUTES STEP 3: If your blood sugar is still low at the 15 minute recheck --> then, go back to STEP 1 and treat AGAIN with another 15 grams of carbohydrates.

## 2018-12-06 DIAGNOSIS — M5412 Radiculopathy, cervical region: Secondary | ICD-10-CM | POA: Diagnosis not present

## 2018-12-06 DIAGNOSIS — I10 Essential (primary) hypertension: Secondary | ICD-10-CM | POA: Diagnosis not present

## 2018-12-06 DIAGNOSIS — R208 Other disturbances of skin sensation: Secondary | ICD-10-CM | POA: Diagnosis not present

## 2018-12-06 DIAGNOSIS — M4802 Spinal stenosis, cervical region: Secondary | ICD-10-CM | POA: Diagnosis not present

## 2018-12-06 DIAGNOSIS — M5030 Other cervical disc degeneration, unspecified cervical region: Secondary | ICD-10-CM | POA: Diagnosis not present

## 2018-12-22 ENCOUNTER — Encounter: Payer: Self-pay | Admitting: Physician Assistant

## 2019-01-31 DIAGNOSIS — M5030 Other cervical disc degeneration, unspecified cervical region: Secondary | ICD-10-CM | POA: Diagnosis not present

## 2019-01-31 DIAGNOSIS — M5412 Radiculopathy, cervical region: Secondary | ICD-10-CM | POA: Diagnosis not present

## 2019-01-31 DIAGNOSIS — G959 Disease of spinal cord, unspecified: Secondary | ICD-10-CM | POA: Diagnosis not present

## 2019-01-31 DIAGNOSIS — M4802 Spinal stenosis, cervical region: Secondary | ICD-10-CM | POA: Diagnosis not present

## 2019-01-31 DIAGNOSIS — I10 Essential (primary) hypertension: Secondary | ICD-10-CM | POA: Diagnosis not present

## 2019-02-06 ENCOUNTER — Other Ambulatory Visit: Payer: Self-pay | Admitting: *Deleted

## 2019-02-06 DIAGNOSIS — Z20822 Contact with and (suspected) exposure to covid-19: Secondary | ICD-10-CM

## 2019-02-08 LAB — NOVEL CORONAVIRUS, NAA: SARS-CoV-2, NAA: NOT DETECTED

## 2019-02-14 DIAGNOSIS — M5030 Other cervical disc degeneration, unspecified cervical region: Secondary | ICD-10-CM | POA: Diagnosis not present

## 2019-02-14 DIAGNOSIS — I10 Essential (primary) hypertension: Secondary | ICD-10-CM | POA: Diagnosis not present

## 2019-02-14 DIAGNOSIS — M5412 Radiculopathy, cervical region: Secondary | ICD-10-CM | POA: Diagnosis not present

## 2019-02-14 DIAGNOSIS — M4802 Spinal stenosis, cervical region: Secondary | ICD-10-CM | POA: Diagnosis not present

## 2019-02-27 DIAGNOSIS — H40013 Open angle with borderline findings, low risk, bilateral: Secondary | ICD-10-CM | POA: Diagnosis not present

## 2019-03-06 NOTE — Progress Notes (Deleted)
Name: Logan Davis  Age/ Sex: 53 y.o., male   MRN/ DOB: [id number], 06-05-1966     PCP: Rosemary Holms   Reason for Endocrinology Evaluation: Type 2 Diabetes Mellitus     Initial Endocrinology Clinic Visit: 09/11/2018    PATIENT IDENTIFIER: Logan Davis is a 53 y.o. male with a past medical history of HTN, dyslipidemia and T2DM. The patient has followed with Endocrinology clinic since 09/11/2018 for consultative assistance with management of his diabetes.  DIABETIC HISTORY:  Logan Davis was diagnosed with T2DM in  2018. He has reported intolerance to Metformin. He has tried [place] in the past without reported intolerance. Insulin has been started shortly after diagnosis.  His hemoglobin A1c has ranged from 8.7% in 2019, peaking at > 15.5% in 2018.   On his initial visit to our clinic his A1c was 13.0 %. He was on Farxiga (but had stopped it a month prior to his presentation),so we stopped it,  he was skipping lantus ~ 3-4 x a week . He was also on Victoza.   Lantus and victoza stopped in 11/2018. Replaced by farxiga and trulicity  SUBJECTIVE:   During the last visit (12/05/18): We increased Metformin, stopped Lantus and  Victoza. Started farxiga and trulicity   Today (03/06/2019): Logan Davis is here for a 3 month follow up on his diabetes management.  He checks his blood sugars 1 times daily, preprandial to breakfast . The patient has not had hypoglycemic episodes since the last clinic visit. 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 Otherwise, the patient has not required any recent emergency interventions for hypoglycemia and has not had recent hospitalizations secondary to hyper or hypoglycemic episodes.    ROS: As per HPI and as detailed below: Review of Systems  Constitutional: Negative for fever.  HENT: Negative for congestion and sore throat.   Cardiovascular: Negative for  chest pain and palpitations.  Gastrointestinal: Negative for diarrhea and nausea.      HOME DIABETES REGIMEN:  Metformin 500 mg XR 2 tab BID Farxiga 10 mg daily  Trulicity 1.5 mg weekly    METER DOWNLOAD SUMMARY: 4/27-5/26 Fingerstick Blood Glucose Tests = 29 Overall Mean FS Glucose = 138.2 Standard Deviation = 29.6  BG Ranges: Low = 86 High = 230  Hypoglycemic Events/30 Days: BG < 50 = 0 Episodes of symptomatic severe hypoglycemia = 0    HISTORY:  Past Medical History:  Past Medical History:  Diagnosis Date  . Diabetes mellitus without complication (HCC)   . Hyperlipidemia   . Hypertension     Past Surgical History: No past surgical history on file.  Social History:  reports that he has never smoked. He has never used smokeless tobacco. He reports that he does not drink alcohol or use drugs. Family History:  Family History  Problem Relation Age of Onset  . Diabetes Mother   . Hypertension Mother   . Hypertension Father   . Diabetes Father   . Diabetes Sister   . Hypertension Sister   . Hyperlipidemia Sister      HOME MEDICATIONS: Allergies as of 03/07/2019      Reactions   Sulfa Antibiotics Anaphylaxis      Medication List       Accurate as of March 06, 2019  3:45 PM. If you have any questions, ask your nurse  or doctor.        amLODipine 10 MG tablet Commonly known as: NORVASC Take by mouth.   atorvastatin 20 MG tablet Commonly known as: LIPITOR Take 1 tablet (20 mg total) by mouth daily.   BD Pen Needle Nano U/F 32G X 4 MM Misc Generic drug: Insulin Pen Needle USE UTD WITH INSULIN AND VICTOZA D   dapagliflozin propanediol 10 MG Tabs tablet Commonly known as: Farxiga Take 10 mg by mouth daily.   Dulaglutide 1.5 MG/0.5ML Sopn Commonly known as: Trulicity Inject 1.5 mg into the skin once a week.   LIFESCAN FINEPOINT LANCETS Misc Use to check blood sugar 3 time(s) daily   losartan 50 MG tablet Commonly known as: COZAAR 100 mg.    metFORMIN 500 MG 24 hr tablet Commonly known as: GLUCOPHAGE-XR Take 2 tablets (1,000 mg total) by mouth 2 (two) times a day.   OneTouch Verio test strip Generic drug: glucose blood Twice daily         DATA REVIEWED: 06/26/2018  BUN/Cr 16/1.44 mg/dL GFR 65 LDL 150 mg/dL  A1c 13.0 %      Results for Logan Davis, Logan Davis (MRN [id number]) as of 12/05/2018 14:37  Ref. Range 12/05/2018 10:32  Sodium Latest Ref Range: 135 - 145 mEq/L 136  Potassium Latest Ref Range: 3.5 - 5.1 mEq/L 3.9  Chloride Latest Ref Range: 96 - 112 mEq/L 101  CO2 Latest Ref Range: 19 - 32 mEq/L 29  Glucose Latest Ref Range: 70 - 99 mg/dL 161 (H)  BUN Latest Ref Range: 6 - 23 mg/dL 19  Creatinine Latest Ref Range: 0.40 - 1.50 mg/dL 1.28  Calcium Latest Ref Range: 8.4 - 10.5 mg/dL 10.2  GFR Latest Ref Range: >60.00 mL/min 71.53  Total CHOL/HDL Ratio Unknown 3  Cholesterol Latest Ref Range: 0 - 200 mg/dL 176  HDL Cholesterol Latest Ref Range: >39.00 mg/dL 52.50  LDL (calc) Latest Ref Range: 0 - 99 mg/dL 106 (H)  NonHDL Unknown 123.23  Triglycerides Latest Ref Range: 0.0 - 149.0 mg/dL 87.0  VLDL Latest Ref Range: 0.0 - 40.0 mg/dL 17.4    ASSESSMENT / PLAN / RECOMMENDATIONS:   1) Type 2 Diabetes Mellitus, Sub-Optimally Controlled, Without  complications - Most recent A1c of 7.8 %. Goal A1c < 7.0%. Down from 13.0%  Plan: - Praised the patient on improved glycemic control. 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 I reassured him and printed out the federal [employer] announcement from 2018.  - I have encouraged him to avoid sugar-sweetened beverages, we discussed ways of cutting down on his carbs while on the road, I have offered him a referral to see our CDE.  - He is tolerating the XR Metformin dose, will increase as  below  - He would like to switch victoza to a weekly alternative such as Trulicity  - He would like to avoid using insulin, and try other alternatives, he would like to [place], he did not have any side effects to it in the past, discussed CV benefits, as well as weight loss and glucose control but we also discussed risk of genital infections.  - I have encouraged him to check his glucose twice a day and to contact me with BG's > 200 or should he start having any  nausea/diarrhea.    MEDICATIONS:  Metformin 500 mg XR to 2 tabs BID - titration provided   Farxiga 10 mg daily   Trulicity 1.5 mg weekly   EDUCATION / INSTRUCTIONS:  BG monitoring instructions: Patient is instructed to check his blood sugars 2 times a day, fasting and bedtime  Call [place] [facility] if: BG persistently < 70 or > 300. . I reviewed the Rule of 15 for the treatment of hypoglycemia in detail with the patient. Literature supplied.    2) Diabetic complications:   Eye: Does not have known diabetic retinopathy.   Neuro/ Feet: Does not have known diabetic peripheral neuropathy.  Renal: Patient does not have known baseline CKD. He is  on an ACEI/ARB at present.   3) Lipids: Patient is on a statin. Previously non-compliant and LDL has been above goal.  Discussed CV benefits of statins. Will stop Simvastatin and start Atorvastatin at 20 mg daily    4) Hypertension: He is above goal of < 140/90 mmHg. Pt non-compliant with medications, encouraged  Compliance with anti-hypertensive meds.     F/U in 3 months    Signed electronically by: Lyndle HerrlichAbby Jaralla , MD  [facility]  [facility] [address] [place], [place] [address] Phone: [phone] FAX: [phone]   CC: Bryon LionsMoreira, Niall A, PA-C [address] [place] [id number] Phone: [phone]  Fax: [phone]  Return to Endocrinology clinic as below: Future  Appointments  Date Time Provider Department Center  03/07/2019  9:30 AM , Konrad DoloresIbtehal Jaralla, MD [facility] None

## 2019-03-07 ENCOUNTER — Ambulatory Visit: Payer: BC Managed Care – PPO | Admitting: Internal Medicine

## 2019-03-08 ENCOUNTER — Other Ambulatory Visit: Payer: Self-pay

## 2019-03-12 ENCOUNTER — Other Ambulatory Visit: Payer: Self-pay

## 2019-03-12 ENCOUNTER — Ambulatory Visit: Payer: BC Managed Care – PPO | Admitting: Internal Medicine

## 2019-03-12 ENCOUNTER — Encounter: Payer: Self-pay | Admitting: Internal Medicine

## 2019-03-12 VITALS — BP 150/102 | HR 113 | Ht 67.0 in | Wt 211.0 lb

## 2019-03-12 DIAGNOSIS — E1165 Type 2 diabetes mellitus with hyperglycemia: Secondary | ICD-10-CM | POA: Diagnosis not present

## 2019-03-12 DIAGNOSIS — E119 Type 2 diabetes mellitus without complications: Secondary | ICD-10-CM | POA: Diagnosis not present

## 2019-03-12 DIAGNOSIS — Z794 Long term (current) use of insulin: Secondary | ICD-10-CM | POA: Diagnosis not present

## 2019-03-12 DIAGNOSIS — Z01812 Encounter for preprocedural laboratory examination: Secondary | ICD-10-CM | POA: Diagnosis not present

## 2019-03-12 DIAGNOSIS — R9431 Abnormal electrocardiogram [ECG] [EKG]: Secondary | ICD-10-CM | POA: Diagnosis not present

## 2019-03-12 DIAGNOSIS — R209 Unspecified disturbances of skin sensation: Secondary | ICD-10-CM | POA: Diagnosis not present

## 2019-03-12 DIAGNOSIS — Z01810 Encounter for preprocedural cardiovascular examination: Secondary | ICD-10-CM | POA: Diagnosis not present

## 2019-03-12 DIAGNOSIS — I10 Essential (primary) hypertension: Secondary | ICD-10-CM | POA: Diagnosis not present

## 2019-03-12 DIAGNOSIS — Z79899 Other long term (current) drug therapy: Secondary | ICD-10-CM | POA: Diagnosis not present

## 2019-03-12 DIAGNOSIS — M542 Cervicalgia: Secondary | ICD-10-CM | POA: Diagnosis not present

## 2019-03-12 DIAGNOSIS — E785 Hyperlipidemia, unspecified: Secondary | ICD-10-CM | POA: Diagnosis not present

## 2019-03-12 DIAGNOSIS — M5412 Radiculopathy, cervical region: Secondary | ICD-10-CM | POA: Diagnosis not present

## 2019-03-12 DIAGNOSIS — Z01818 Encounter for other preprocedural examination: Secondary | ICD-10-CM | POA: Diagnosis not present

## 2019-03-12 DIAGNOSIS — M5030 Other cervical disc degeneration, unspecified cervical region: Secondary | ICD-10-CM | POA: Diagnosis not present

## 2019-03-12 LAB — POCT GLYCOSYLATED HEMOGLOBIN (HGB A1C): Hemoglobin A1C: 7.8 % — AB (ref 4.0–5.6)

## 2019-03-12 NOTE — Progress Notes (Signed)
Name: Logan Davis Logan Davis  Age/ Sex: 53 y.o., male   MRN/ DOB: [id number], 02-07-1966     PCP: Rosemary HolmsMoreira, Niall A, PA-C   Reason for Endocrinology Evaluation: Type 2 Diabetes Mellitus     Initial Endocrinology Clinic Visit: 09/11/2018    PATIENT IDENTIFIER: [name] Logan Davis is a 53 y.o. male with a past medical history of HTN, dyslipidemia and T2DM. The patient has followed with Endocrinology clinic since 09/11/2018 for consultative assistance with management of his diabetes.  DIABETIC HISTORY:  [name] was diagnosed with T2DM in  2018. He has reported intolerance to Metformin. He has tried [place]Farxiga in the past without reported intolerance. Insulin has been started shortly after diagnosis.  His hemoglobin A1c has ranged from 8.7% in 2019, peaking at > 15.5% in 2018.   On his initial visit to our clinic his A1c was 13.0 %. He was on Farxiga (but had stopped it a month prior to his presentation),so we stopped it,  he was skipping lantus ~ 3-4 x a week . He was also on Victoza.   Lantus and victoza stopped in 11/2018. Replaced by farxiga and trulicity  SUBJECTIVE:   During the last visit (12/05/18): We increased Metformin, stopped Lantus and  Victoza. Started farxiga and trulicity   Today (03/12/2019): [name] is here for a 3 month follow up on his diabetes management.  He checks his blood sugars 1 times daily, preprandial to breakfast . The patient has not had hypoglycemic episodes since the last clinic visit. 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  Otherwise, the patient has not required any recent emergency interventions for hypoglycemia and has not had recent hospitalizations secondary to hyper or hypoglycemic episodes.    ROS: As per HPI and as detailed below: Review of Systems  Constitutional: Negative for fever.  HENT: Negative for congestion and sore throat.   Cardiovascular: Negative for chest pain and  palpitations.  Gastrointestinal: Negative for diarrhea and nausea.      HOME DIABETES REGIMEN:  Metformin 500 mg XR 2 tab BID- taking 1 tablet a day  Farxiga 10 mg daily  Victoza 1.8 mg  Lantus 20 units   METER DOWNLOAD SUMMARY: did not bring meter     HISTORY:  Past Medical History:  Past Medical History:  Diagnosis Date  . Diabetes mellitus without complication (HCC)   . Hyperlipidemia   . Hypertension     Past Surgical History: No past surgical history on file.  Social History:  reports that he has never smoked. He has never used smokeless tobacco. He reports that he does not drink alcohol or use drugs. Family History:  Family History  Problem Relation Age of Onset  . Diabetes Mother   . Hypertension Mother   . Hypertension Father   . Diabetes Father   . Diabetes Sister   . Hypertension Sister   . Hyperlipidemia Sister      HOME MEDICATIONS: Allergies as of 03/12/2019      Reactions   Sulfa Antibiotics Anaphylaxis      Medication List       Accurate as of March 12, 2019 12:11 PM. If you have any questions, ask your nurse or doctor.        amLODipine 10 MG tablet Commonly known as: NORVASC Take by mouth.   atorvastatin 20 MG tablet Commonly known as: LIPITOR Take 1 tablet (20 mg total)  by mouth daily.   BD Pen Needle Nano U/F 32G X 4 MM Misc Generic drug: Insulin Pen Needle USE UTD WITH INSULIN AND VICTOZA D   dapagliflozin propanediol 10 MG Tabs tablet Commonly known as: Farxiga Take 10 mg by mouth daily.   Dulaglutide 1.5 MG/0.5ML Sopn Commonly known as: Trulicity Inject 1.5 mg into the skin once a week.   LIFESCAN FINEPOINT LANCETS Misc Use to check blood sugar 3 time(s) daily   losartan 50 MG tablet Commonly known as: COZAAR 100 mg.   metFORMIN 500 MG 24 hr tablet Commonly known as: GLUCOPHAGE-XR Take 2 tablets (1,000 mg total) by mouth 2 (two) times a day.   OneTouch Verio test strip Generic drug: glucose blood Twice daily          DATA REVIEWED: 06/26/2018  BUN/Cr 16/1.44 mg/dL GFR 65 LDL 150 mg/dL  A1c 13.0 %      Results for YASUO, PHIMMASONE (MRN [id number]) as of 12/05/2018 14:37  Ref. Range 12/05/2018 10:32  Sodium Latest Ref Range: 135 - 145 mEq/Davis 136  Potassium Latest Ref Range: 3.5 - 5.1 mEq/Davis 3.9  Chloride Latest Ref Range: 96 - 112 mEq/Davis 101  CO2 Latest Ref Range: 19 - 32 mEq/Davis 29  Glucose Latest Ref Range: 70 - 99 mg/dL 161 (H)  BUN Latest Ref Range: 6 - 23 mg/dL 19  Creatinine Latest Ref Range: 0.40 - 1.50 mg/dL 1.28  Calcium Latest Ref Range: 8.4 - 10.5 mg/dL 10.2  GFR Latest Ref Range: >60.00 mL/min 71.53  Total CHOL/HDL Ratio Unknown 3  Cholesterol Latest Ref Range: 0 - 200 mg/dL 176  HDL Cholesterol Latest Ref Range: >39.00 mg/dL 52.50  LDL (calc) Latest Ref Range: 0 - 99 mg/dL 106 (H)  NonHDL Unknown 123.23  Triglycerides Latest Ref Range: 0.0 - 149.0 mg/dL 87.0  VLDL Latest Ref Range: 0.0 - 40.0 mg/dL 17.4    ASSESSMENT / PLAN / RECOMMENDATIONS:   1) Type 2 Diabetes Mellitus, Sub-Optimally Controlled, Without  complications - Most recent A1c of 7.8 %. Goal A1c < 7.0%. Down from 13.0%   - His A1c is stable at 7.8%.  - On his last visit we switched victoza to Trulicity for ease of take but he continues to be on Victoza and would like to continue this for now - He also opted to continue with Lantus  - He reduced metformin dose from 4 tablets to 1 tablet due to diarrhea but he believes he can try taking it BID - Unfortunately he continues with dietary indiscretions, he has been eating candy and drinking sweet teat and regular sodas, he is also on a protein supplement for working out that he takes 3x weekly.    MEDICATIONS:  Metformin 500 mg XR 1 tabs BID   Farxiga 10 mg daily   Victoza 1.8 mg daily   Lantus 24 units   EDUCATION / INSTRUCTIONS:  BG monitoring instructions: Patient is instructed to check his blood sugars 2 times a day, fasting and bedtime  Call  [place] [facility] if: BG persistently < 70 or > 300. . I reviewed the Rule of 15 for the treatment of hypoglycemia in detail with the patient. Literature supplied.    2) Diabetic complications:   Eye: Does not have known diabetic retinopathy.   Neuro/ Feet: Does not have known diabetic peripheral neuropathy.  Renal: Patient does not have known baseline CKD. He is  on an ACEI/ARB at present.   3) Hypertension: He is above goal of <  140/90 mmHg. We encouraged compliance, will defer further HTN management to his PCP.     F/U in 3 months    Signed electronically by: Lyndle Herrlich, MD  [facility]  [facility] [address] [place], [place] [address] Phone: [phone] FAX: [phone]   CC: Bryon Lions, PA-C [address] [place] [address] Phone: [phone]  Fax: [phone]  Return to Endocrinology clinic as below: Future Appointments  Date Time Provider Department Center  03/12/2019  3:40 PM Keisi Eckford, Konrad Dolores, MD [facility] None

## 2019-03-12 NOTE — Patient Instructions (Signed)
-    Metformin 500 mg 1 tablet twice a day with meals - Continue Farxiga 10 mg daily  - Continue Victoza 1.8 mg daily  - Continue Lantus 20 units daily

## 2019-03-13 ENCOUNTER — Encounter: Payer: Self-pay | Admitting: Internal Medicine

## 2019-03-13 LAB — BASIC METABOLIC PANEL
BUN: 15 mg/dL (ref 6–23)
CO2: 30 mEq/L (ref 19–32)
Calcium: 9.6 mg/dL (ref 8.4–10.5)
Chloride: 104 mEq/L (ref 96–112)
Creatinine, Ser: 1.32 mg/dL (ref 0.40–1.50)
GFR: 68.96 mL/min (ref >60.00)
Glucose, Bld: 134 mg/dL — ABNORMAL HIGH (ref 70–99)
Potassium: 3.9 mEq/L (ref 3.5–5.1)
Sodium: 142 mEq/L (ref 135–145)

## 2019-03-14 DIAGNOSIS — M5412 Radiculopathy, cervical region: Secondary | ICD-10-CM | POA: Diagnosis not present

## 2019-03-14 DIAGNOSIS — R209 Unspecified disturbances of skin sensation: Secondary | ICD-10-CM | POA: Diagnosis not present

## 2019-03-14 DIAGNOSIS — M542 Cervicalgia: Secondary | ICD-10-CM | POA: Diagnosis not present

## 2019-03-16 DIAGNOSIS — M5412 Radiculopathy, cervical region: Secondary | ICD-10-CM | POA: Diagnosis not present

## 2019-03-16 DIAGNOSIS — Z01818 Encounter for other preprocedural examination: Secondary | ICD-10-CM | POA: Diagnosis not present

## 2019-03-16 DIAGNOSIS — R209 Unspecified disturbances of skin sensation: Secondary | ICD-10-CM | POA: Diagnosis not present

## 2019-03-16 DIAGNOSIS — M542 Cervicalgia: Secondary | ICD-10-CM | POA: Diagnosis not present

## 2019-03-20 DIAGNOSIS — E785 Hyperlipidemia, unspecified: Secondary | ICD-10-CM | POA: Diagnosis not present

## 2019-03-20 DIAGNOSIS — Z794 Long term (current) use of insulin: Secondary | ICD-10-CM | POA: Diagnosis not present

## 2019-03-20 DIAGNOSIS — G5711 Meralgia paresthetica, right lower limb: Secondary | ICD-10-CM | POA: Diagnosis not present

## 2019-03-20 DIAGNOSIS — E669 Obesity, unspecified: Secondary | ICD-10-CM | POA: Diagnosis not present

## 2019-03-20 DIAGNOSIS — Z981 Arthrodesis status: Secondary | ICD-10-CM | POA: Diagnosis not present

## 2019-03-20 DIAGNOSIS — Z882 Allergy status to sulfonamides status: Secondary | ICD-10-CM | POA: Diagnosis not present

## 2019-03-20 DIAGNOSIS — Z79899 Other long term (current) drug therapy: Secondary | ICD-10-CM | POA: Diagnosis not present

## 2019-03-20 DIAGNOSIS — M50221 Other cervical disc displacement at C4-C5 level: Secondary | ICD-10-CM | POA: Diagnosis not present

## 2019-03-20 DIAGNOSIS — E1165 Type 2 diabetes mellitus with hyperglycemia: Secondary | ICD-10-CM | POA: Diagnosis not present

## 2019-03-20 DIAGNOSIS — M5000 Cervical disc disorder with myelopathy, unspecified cervical region: Secondary | ICD-10-CM | POA: Diagnosis not present

## 2019-03-20 DIAGNOSIS — M542 Cervicalgia: Secondary | ICD-10-CM | POA: Diagnosis not present

## 2019-03-20 DIAGNOSIS — N183 Chronic kidney disease, stage 3 (moderate): Secondary | ICD-10-CM | POA: Diagnosis not present

## 2019-03-20 DIAGNOSIS — M5021 Other cervical disc displacement,  high cervical region: Secondary | ICD-10-CM | POA: Diagnosis not present

## 2019-03-20 DIAGNOSIS — I129 Hypertensive chronic kidney disease with stage 1 through stage 4 chronic kidney disease, or unspecified chronic kidney disease: Secondary | ICD-10-CM | POA: Diagnosis not present

## 2019-03-20 DIAGNOSIS — M4712 Other spondylosis with myelopathy, cervical region: Secondary | ICD-10-CM | POA: Diagnosis not present

## 2019-03-20 DIAGNOSIS — G9520 Unspecified cord compression: Secondary | ICD-10-CM | POA: Diagnosis not present

## 2019-03-20 DIAGNOSIS — G959 Disease of spinal cord, unspecified: Secondary | ICD-10-CM | POA: Diagnosis not present

## 2019-03-20 DIAGNOSIS — E1122 Type 2 diabetes mellitus with diabetic chronic kidney disease: Secondary | ICD-10-CM | POA: Diagnosis not present

## 2019-03-20 DIAGNOSIS — M5001 Cervical disc disorder with myelopathy,  high cervical region: Secondary | ICD-10-CM | POA: Diagnosis not present

## 2019-03-20 DIAGNOSIS — M4802 Spinal stenosis, cervical region: Secondary | ICD-10-CM | POA: Diagnosis not present

## 2019-04-10 DIAGNOSIS — Z4789 Encounter for other orthopedic aftercare: Secondary | ICD-10-CM | POA: Diagnosis not present

## 2019-04-10 DIAGNOSIS — Z981 Arthrodesis status: Secondary | ICD-10-CM | POA: Diagnosis not present

## 2019-04-17 DIAGNOSIS — M2560 Stiffness of unspecified joint, not elsewhere classified: Secondary | ICD-10-CM | POA: Diagnosis not present

## 2019-04-17 DIAGNOSIS — M5412 Radiculopathy, cervical region: Secondary | ICD-10-CM | POA: Diagnosis not present

## 2019-04-17 DIAGNOSIS — M6281 Muscle weakness (generalized): Secondary | ICD-10-CM | POA: Diagnosis not present

## 2019-04-20 DIAGNOSIS — M5412 Radiculopathy, cervical region: Secondary | ICD-10-CM | POA: Diagnosis not present

## 2019-04-20 DIAGNOSIS — M2560 Stiffness of unspecified joint, not elsewhere classified: Secondary | ICD-10-CM | POA: Diagnosis not present

## 2019-04-20 DIAGNOSIS — M6281 Muscle weakness (generalized): Secondary | ICD-10-CM | POA: Diagnosis not present

## 2019-04-23 DIAGNOSIS — M5412 Radiculopathy, cervical region: Secondary | ICD-10-CM | POA: Diagnosis not present

## 2019-04-23 DIAGNOSIS — M6281 Muscle weakness (generalized): Secondary | ICD-10-CM | POA: Diagnosis not present

## 2019-04-23 DIAGNOSIS — M2560 Stiffness of unspecified joint, not elsewhere classified: Secondary | ICD-10-CM | POA: Diagnosis not present

## 2019-04-25 DIAGNOSIS — M5412 Radiculopathy, cervical region: Secondary | ICD-10-CM | POA: Diagnosis not present

## 2019-04-25 DIAGNOSIS — M6281 Muscle weakness (generalized): Secondary | ICD-10-CM | POA: Diagnosis not present

## 2019-04-25 DIAGNOSIS — M2560 Stiffness of unspecified joint, not elsewhere classified: Secondary | ICD-10-CM | POA: Diagnosis not present

## 2019-04-30 DIAGNOSIS — M2560 Stiffness of unspecified joint, not elsewhere classified: Secondary | ICD-10-CM | POA: Diagnosis not present

## 2019-04-30 DIAGNOSIS — M5412 Radiculopathy, cervical region: Secondary | ICD-10-CM | POA: Diagnosis not present

## 2019-04-30 DIAGNOSIS — M6281 Muscle weakness (generalized): Secondary | ICD-10-CM | POA: Diagnosis not present

## 2019-05-04 DIAGNOSIS — G5601 Carpal tunnel syndrome, right upper limb: Secondary | ICD-10-CM | POA: Diagnosis not present

## 2019-05-04 DIAGNOSIS — M6281 Muscle weakness (generalized): Secondary | ICD-10-CM | POA: Diagnosis not present

## 2019-05-04 DIAGNOSIS — I10 Essential (primary) hypertension: Secondary | ICD-10-CM | POA: Diagnosis not present

## 2019-05-04 DIAGNOSIS — M2560 Stiffness of unspecified joint, not elsewhere classified: Secondary | ICD-10-CM | POA: Diagnosis not present

## 2019-05-04 DIAGNOSIS — M5412 Radiculopathy, cervical region: Secondary | ICD-10-CM | POA: Diagnosis not present

## 2019-05-07 DIAGNOSIS — M5412 Radiculopathy, cervical region: Secondary | ICD-10-CM | POA: Diagnosis not present

## 2019-05-07 DIAGNOSIS — M2560 Stiffness of unspecified joint, not elsewhere classified: Secondary | ICD-10-CM | POA: Diagnosis not present

## 2019-05-07 DIAGNOSIS — M6281 Muscle weakness (generalized): Secondary | ICD-10-CM | POA: Diagnosis not present

## 2019-05-11 DIAGNOSIS — M6281 Muscle weakness (generalized): Secondary | ICD-10-CM | POA: Diagnosis not present

## 2019-05-11 DIAGNOSIS — M5412 Radiculopathy, cervical region: Secondary | ICD-10-CM | POA: Diagnosis not present

## 2019-05-11 DIAGNOSIS — M2560 Stiffness of unspecified joint, not elsewhere classified: Secondary | ICD-10-CM | POA: Diagnosis not present

## 2019-05-16 DIAGNOSIS — M5412 Radiculopathy, cervical region: Secondary | ICD-10-CM | POA: Diagnosis not present

## 2019-05-16 DIAGNOSIS — M2560 Stiffness of unspecified joint, not elsewhere classified: Secondary | ICD-10-CM | POA: Diagnosis not present

## 2019-05-16 DIAGNOSIS — M6281 Muscle weakness (generalized): Secondary | ICD-10-CM | POA: Diagnosis not present

## 2019-05-17 DIAGNOSIS — M2560 Stiffness of unspecified joint, not elsewhere classified: Secondary | ICD-10-CM | POA: Diagnosis not present

## 2019-05-17 DIAGNOSIS — M5412 Radiculopathy, cervical region: Secondary | ICD-10-CM | POA: Diagnosis not present

## 2019-05-17 DIAGNOSIS — M6281 Muscle weakness (generalized): Secondary | ICD-10-CM | POA: Diagnosis not present

## 2019-05-21 DIAGNOSIS — Z981 Arthrodesis status: Secondary | ICD-10-CM | POA: Diagnosis not present

## 2019-05-21 DIAGNOSIS — Z4789 Encounter for other orthopedic aftercare: Secondary | ICD-10-CM | POA: Diagnosis not present

## 2019-05-21 DIAGNOSIS — M2560 Stiffness of unspecified joint, not elsewhere classified: Secondary | ICD-10-CM | POA: Diagnosis not present

## 2019-05-21 DIAGNOSIS — M6281 Muscle weakness (generalized): Secondary | ICD-10-CM | POA: Diagnosis not present

## 2019-05-21 DIAGNOSIS — M5412 Radiculopathy, cervical region: Secondary | ICD-10-CM | POA: Diagnosis not present

## 2019-05-29 DIAGNOSIS — Z794 Long term (current) use of insulin: Secondary | ICD-10-CM | POA: Diagnosis not present

## 2019-05-29 DIAGNOSIS — E1165 Type 2 diabetes mellitus with hyperglycemia: Secondary | ICD-10-CM | POA: Diagnosis not present

## 2019-05-29 DIAGNOSIS — F411 Generalized anxiety disorder: Secondary | ICD-10-CM | POA: Diagnosis not present

## 2019-05-29 DIAGNOSIS — I10 Essential (primary) hypertension: Secondary | ICD-10-CM | POA: Diagnosis not present

## 2019-05-30 DIAGNOSIS — M6281 Muscle weakness (generalized): Secondary | ICD-10-CM | POA: Diagnosis not present

## 2019-05-30 DIAGNOSIS — M5412 Radiculopathy, cervical region: Secondary | ICD-10-CM | POA: Diagnosis not present

## 2019-05-30 DIAGNOSIS — M2560 Stiffness of unspecified joint, not elsewhere classified: Secondary | ICD-10-CM | POA: Diagnosis not present

## 2019-05-31 DIAGNOSIS — M5412 Radiculopathy, cervical region: Secondary | ICD-10-CM | POA: Diagnosis not present

## 2019-05-31 DIAGNOSIS — M6281 Muscle weakness (generalized): Secondary | ICD-10-CM | POA: Diagnosis not present

## 2019-05-31 DIAGNOSIS — M2560 Stiffness of unspecified joint, not elsewhere classified: Secondary | ICD-10-CM | POA: Diagnosis not present

## 2019-06-06 DIAGNOSIS — M6281 Muscle weakness (generalized): Secondary | ICD-10-CM | POA: Diagnosis not present

## 2019-06-06 DIAGNOSIS — M2560 Stiffness of unspecified joint, not elsewhere classified: Secondary | ICD-10-CM | POA: Diagnosis not present

## 2019-06-06 DIAGNOSIS — M5412 Radiculopathy, cervical region: Secondary | ICD-10-CM | POA: Diagnosis not present

## 2019-06-07 DIAGNOSIS — M6281 Muscle weakness (generalized): Secondary | ICD-10-CM | POA: Diagnosis not present

## 2019-06-07 DIAGNOSIS — M2560 Stiffness of unspecified joint, not elsewhere classified: Secondary | ICD-10-CM | POA: Diagnosis not present

## 2019-06-07 DIAGNOSIS — M5412 Radiculopathy, cervical region: Secondary | ICD-10-CM | POA: Diagnosis not present

## 2019-06-11 DIAGNOSIS — M5412 Radiculopathy, cervical region: Secondary | ICD-10-CM | POA: Diagnosis not present

## 2019-06-11 DIAGNOSIS — M2560 Stiffness of unspecified joint, not elsewhere classified: Secondary | ICD-10-CM | POA: Diagnosis not present

## 2019-06-11 DIAGNOSIS — M6281 Muscle weakness (generalized): Secondary | ICD-10-CM | POA: Diagnosis not present

## 2019-06-12 DIAGNOSIS — M5412 Radiculopathy, cervical region: Secondary | ICD-10-CM | POA: Diagnosis not present

## 2019-06-12 DIAGNOSIS — E1165 Type 2 diabetes mellitus with hyperglycemia: Secondary | ICD-10-CM | POA: Diagnosis not present

## 2019-06-12 DIAGNOSIS — I129 Hypertensive chronic kidney disease with stage 1 through stage 4 chronic kidney disease, or unspecified chronic kidney disease: Secondary | ICD-10-CM | POA: Diagnosis not present

## 2019-06-12 DIAGNOSIS — M6281 Muscle weakness (generalized): Secondary | ICD-10-CM | POA: Diagnosis not present

## 2019-06-12 DIAGNOSIS — I10 Essential (primary) hypertension: Secondary | ICD-10-CM | POA: Diagnosis not present

## 2019-06-12 DIAGNOSIS — M2560 Stiffness of unspecified joint, not elsewhere classified: Secondary | ICD-10-CM | POA: Diagnosis not present

## 2019-06-12 DIAGNOSIS — N183 Chronic kidney disease, stage 3 unspecified: Secondary | ICD-10-CM | POA: Diagnosis not present

## 2019-06-12 DIAGNOSIS — E785 Hyperlipidemia, unspecified: Secondary | ICD-10-CM | POA: Diagnosis not present

## 2019-06-12 DIAGNOSIS — Z794 Long term (current) use of insulin: Secondary | ICD-10-CM | POA: Diagnosis not present

## 2019-06-12 DIAGNOSIS — F411 Generalized anxiety disorder: Secondary | ICD-10-CM | POA: Diagnosis not present

## 2019-06-15 DIAGNOSIS — M5412 Radiculopathy, cervical region: Secondary | ICD-10-CM | POA: Diagnosis not present

## 2019-06-15 DIAGNOSIS — M2560 Stiffness of unspecified joint, not elsewhere classified: Secondary | ICD-10-CM | POA: Diagnosis not present

## 2019-06-15 DIAGNOSIS — M6281 Muscle weakness (generalized): Secondary | ICD-10-CM | POA: Diagnosis not present

## 2019-06-18 DIAGNOSIS — M6281 Muscle weakness (generalized): Secondary | ICD-10-CM | POA: Diagnosis not present

## 2019-06-18 DIAGNOSIS — M2560 Stiffness of unspecified joint, not elsewhere classified: Secondary | ICD-10-CM | POA: Diagnosis not present

## 2019-06-18 DIAGNOSIS — M5412 Radiculopathy, cervical region: Secondary | ICD-10-CM | POA: Diagnosis not present

## 2019-06-19 DIAGNOSIS — M2560 Stiffness of unspecified joint, not elsewhere classified: Secondary | ICD-10-CM | POA: Diagnosis not present

## 2019-06-19 DIAGNOSIS — M5412 Radiculopathy, cervical region: Secondary | ICD-10-CM | POA: Diagnosis not present

## 2019-06-19 DIAGNOSIS — M6281 Muscle weakness (generalized): Secondary | ICD-10-CM | POA: Diagnosis not present

## 2019-07-02 DIAGNOSIS — M2560 Stiffness of unspecified joint, not elsewhere classified: Secondary | ICD-10-CM | POA: Diagnosis not present

## 2019-07-02 DIAGNOSIS — M6281 Muscle weakness (generalized): Secondary | ICD-10-CM | POA: Diagnosis not present

## 2019-07-02 DIAGNOSIS — M5412 Radiculopathy, cervical region: Secondary | ICD-10-CM | POA: Diagnosis not present

## 2019-07-03 DIAGNOSIS — M6281 Muscle weakness (generalized): Secondary | ICD-10-CM | POA: Diagnosis not present

## 2019-07-03 DIAGNOSIS — M5412 Radiculopathy, cervical region: Secondary | ICD-10-CM | POA: Diagnosis not present

## 2019-07-03 DIAGNOSIS — M2560 Stiffness of unspecified joint, not elsewhere classified: Secondary | ICD-10-CM | POA: Diagnosis not present

## 2019-07-05 ENCOUNTER — Other Ambulatory Visit: Payer: Self-pay

## 2019-07-05 DIAGNOSIS — M6281 Muscle weakness (generalized): Secondary | ICD-10-CM | POA: Diagnosis not present

## 2019-07-05 DIAGNOSIS — M2560 Stiffness of unspecified joint, not elsewhere classified: Secondary | ICD-10-CM | POA: Diagnosis not present

## 2019-07-05 DIAGNOSIS — M5412 Radiculopathy, cervical region: Secondary | ICD-10-CM | POA: Diagnosis not present

## 2019-07-09 ENCOUNTER — Encounter: Payer: Self-pay | Admitting: Internal Medicine

## 2019-07-09 ENCOUNTER — Ambulatory Visit: Payer: BC Managed Care – PPO | Admitting: Internal Medicine

## 2019-07-09 VITALS — BP 140/90 | HR 103 | Ht 67.0 in | Wt 215.6 lb

## 2019-07-09 DIAGNOSIS — E1165 Type 2 diabetes mellitus with hyperglycemia: Secondary | ICD-10-CM

## 2019-07-09 DIAGNOSIS — Z794 Long term (current) use of insulin: Secondary | ICD-10-CM

## 2019-07-09 DIAGNOSIS — M5412 Radiculopathy, cervical region: Secondary | ICD-10-CM | POA: Diagnosis not present

## 2019-07-09 DIAGNOSIS — M2560 Stiffness of unspecified joint, not elsewhere classified: Secondary | ICD-10-CM | POA: Diagnosis not present

## 2019-07-09 DIAGNOSIS — M6281 Muscle weakness (generalized): Secondary | ICD-10-CM | POA: Diagnosis not present

## 2019-07-09 LAB — GLUCOSE, POCT (MANUAL RESULT ENTRY): POC Glucose: 290 mg/dl — AB (ref 70–99)

## 2019-07-09 LAB — POCT GLYCOSYLATED HEMOGLOBIN (HGB A1C): Hemoglobin A1C: 7.9 % — AB (ref 4.0–5.6)

## 2019-07-09 NOTE — Progress Notes (Signed)
Name: Logan Davis  Age/ Sex: 54 y.o., male   MRN/ DOB: [id number], 11-13-1965     PCP: Rosemary Holms   Reason for Endocrinology Evaluation: Type 2 Diabetes Mellitus     Initial Endocrinology Clinic Visit: 09/11/2018    PATIENT IDENTIFIER: Logan Davis is a 54 y.o. male with a past medical history of HTN, dyslipidemia and T2DM. The patient has followed with Endocrinology clinic since 09/11/2018 for consultative assistance with management of his diabetes.  DIABETIC HISTORY:  Logan Davis was diagnosed with T2DM in  2018. He has reported intolerance to Metformin. He has tried [place] in the past without reported intolerance. Insulin has been started shortly after diagnosis.  His hemoglobin A1c has ranged from 8.7% in 2019, peaking at > 15.5% in 2018.   On his initial visit to our clinic his A1c was 13.0 %. He was on Farxiga (but had stopped it a month prior to his presentation),so we stopped it,  he was skipping lantus ~ 3-4 x a week . He was also on Victoza. We attempted to switch Victoza to trulicity but he opted to continue with Victoza.  In 11/2018 we started Marcelline Deist but he stopped taking it on his own 06/2019    Lantus and victoza stopped in 11/2018. Replaced by farxiga and trulicity  SUBJECTIVE:   During the last visit (03/12/19): A1c 7.8 %. We continued  Metformin, farxiga, lantus and victoza.   Today (07/09/2019): Logan Davis is here for a 3 month follow up on his diabetes management.  He checks his blood sugars 0 times daily for the past. The patient has not had hypoglycemic episodes since the last clinic visit. 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 Otherwise, the patient has not required any recent emergency interventions for hypoglycemia and has not had recent hospitalizations secondary to hyper or hypoglycemic episodes.   He has not been taking farxiga in 2-3 months ago, stopped lantus and victoza in the past 2 weeks because he was at the beach  .   ROS: As per HPI and as detailed below: Review of Systems  Constitutional: Negative for fever.  HENT: Negative for congestion and sore throat.   Cardiovascular: Negative for chest pain and palpitations.  Gastrointestinal: Negative for diarrhea and nausea.      HOME DIABETES REGIMEN:  Metformin 500 mg XR 1 tab BID- takes one tablet daily  Farxiga 10 mg daily  Victoza 1.8 mg  Lantus 24 units   METER DOWNLOAD SUMMARY: did not bring meter     HISTORY:  Past Medical History:  Past Medical History:  Diagnosis Date  . Diabetes mellitus without complication (HCC)   . Hyperlipidemia   . Hypertension     Past Surgical History: No past surgical history on file.  Social History:  reports that he has never smoked. He has never used smokeless tobacco. He reports that he does not drink alcohol or use drugs. Family History:  Family History  Problem Relation Age of Onset  . Diabetes Mother   . Hypertension Mother   . Hypertension Father   . Diabetes Father   . Diabetes Sister   . Hypertension Sister   . Hyperlipidemia Sister      HOME MEDICATIONS: Allergies as of 07/09/2019      Reactions   Sulfa Antibiotics Anaphylaxis      Medication List       Accurate as of July 09, 2019 11:10 AM. If you  have any questions, ask your nurse or doctor.        atorvastatin 20 MG tablet Commonly known as: LIPITOR Take 1 tablet (20 mg total) by mouth daily.   BD Pen Needle Nano U/F 32G X 4 MM Misc Generic drug: Insulin Pen Needle USE UTD WITH INSULIN AND VICTOZA D   dapagliflozin propanediol 10 MG Tabs tablet Commonly known as: Farxiga Take 10 mg by mouth daily.   insulin glargine 100 UNIT/ML injection Commonly known as: LANTUS Inject 20 Units into the skin daily.   LIFESCAN FINEPOINT LANCETS Misc Use to check blood sugar 3 time(s) daily   liraglutide 18 MG/3ML Sopn Commonly known as: VICTOZA Inject 1.8 mg into the skin daily.   losartan 50 MG tablet Commonly  known as: COZAAR 100 mg.   metFORMIN 500 MG 24 hr tablet Commonly known as: GLUCOPHAGE-XR Take 2 tablets (1,000 mg total) by mouth 2 (two) times a day.   OneTouch Verio test strip Generic drug: glucose blood Twice daily         DATA REVIEWED: 06/26/2018  BUN/Cr 16/1.44 mg/dL GFR 65 O2D 74.1 %     Results for Logan Davis (MRN [id number]) as of 07/11/2019 08:15  Ref. Range 07/09/2019 16:43  Creatinine,U Latest Units: mg/dL 094.7  Microalb, Ur Latest Ref Range: 0.0 - 1.9 mg/dL 4.4 (H)  MICROALB/CREAT RATIO Latest Ref Range: 0.0 - 30.0 mg/g 0.9    In Office BG 290 mg/dL  ASSESSMENT / PLAN / RECOMMENDATIONS:   1) Type 2 Diabetes Mellitus, Sub-Optimally Controlled, Without  complications - Most recent A1c of 7.9 %. Goal A1c < 7.0%   - Poorly controlled diabetes due to continued medication non-adherence and dietary indiscretions. - He stopped taking Marcelline Deist because his sugars "have been good" I explained to him that his glucose readings are good because he was taking [place]. We discussed the cardiovascular, glucose lowering benefits and weight loss benefits with [place]. He is intolerant to higher doses of metformin.  - He has not taking lantus and victoza in 2 weeks, he went to the beach and did not refill prior to his trip. - We again discussed the importance of medication adherence and dietary indiscretions. He started a church fast last week, eats from 7AM to 7PM, no meat, but has been eating mainly  CHO but not a lot of protein. His In-office was 290 mg/dL , this is after eating 3 bananas and chips prior to coming here. Pt will be referred to our RD ( he had declined in the past) but today we discussed the importance of dietary education.     MEDICATIONS:  Metformin 500 mg XR 1 tab daily   Restart Farxiga 10 mg daily   Restart Victoza 1.8 mg daily   Restart Lantus and reduce to 20 units   EDUCATION / INSTRUCTIONS:  BG monitoring instructions: Patient is  instructed to check his blood sugars 2 times a day, fasting and bedtime  Call [place] [facility] if: BG persistently < 70 or > 300. . I reviewed the Rule of 15 for the treatment of hypoglycemia in detail with the patient. Literature supplied.    2) Diabetic complications:   Eye: Does not have known diabetic retinopathy.   Neuro/ Feet: Does not have known diabetic peripheral neuropathy.  Renal: Patient does not have known baseline CKD. He is  on an ACEI/ARB at present. Microalbumin      F/U in 3 months    Signed electronically by: Lyndle Herrlich,  MD  [facility]  [facility] [address] [place], [place] [address] Phone: [phone] FAX: [phone]   CC: Loyola Mast, PA-C [place] [address] Phone: [phone]  Fax: [phone]  Return to Endocrinology clinic as below: Future Appointments  Date Time Provider [place]  07/09/2019  3:40 PM Leronda Lewers, Melanie Crazier, MD [facility] None

## 2019-07-09 NOTE — Patient Instructions (Addendum)
-   Metformin 500 mg 1 tablet daily  - Restart Farxiga 10 mg daily  - Restart Victoza 1.8 mg daily  - Restart  Lantus 20 units daily     - Check sugar before breakfast and again at bedtime  - Please incorporate more protein in your diet, currently you are eating mainly starches and fiber which will raise your sugar       - HOW TO TREAT LOW BLOOD SUGARS (Blood sugar LESS THAN 70 MG/DL)  Please follow the RULE OF 15 for the treatment of hypoglycemia treatment (when your (blood sugars are less than 70 mg/dL)    STEP 1: Take 15 grams of carbohydrates when your blood sugar is low, which includes:   3-4 GLUCOSE TABS  OR  3-4 OZ OF JUICE OR REGULAR SODA OR  ONE TUBE OF GLUCOSE GEL     STEP 2: RECHECK blood sugar in 15 MINUTES STEP 3: If your blood sugar is still low at the 15 minute recheck --> then, go back to STEP 1 and treat AGAIN with another 15 grams of carbohydrates.

## 2019-07-10 LAB — MICROALBUMIN / CREATININE URINE RATIO
Creatinine,U: 483.4 mg/dL
Microalb Creat Ratio: 0.9 mg/g (ref 0.0–30.0)
Microalb, Ur: 4.4 mg/dL — ABNORMAL HIGH (ref 0.0–1.9)

## 2019-07-11 ENCOUNTER — Encounter: Payer: Self-pay | Admitting: Internal Medicine

## 2019-07-11 DIAGNOSIS — M5412 Radiculopathy, cervical region: Secondary | ICD-10-CM | POA: Diagnosis not present

## 2019-07-11 DIAGNOSIS — M2560 Stiffness of unspecified joint, not elsewhere classified: Secondary | ICD-10-CM | POA: Diagnosis not present

## 2019-07-11 DIAGNOSIS — M6281 Muscle weakness (generalized): Secondary | ICD-10-CM | POA: Diagnosis not present

## 2019-07-23 ENCOUNTER — Other Ambulatory Visit: Payer: Self-pay

## 2019-07-23 ENCOUNTER — Telehealth: Payer: Self-pay | Admitting: Internal Medicine

## 2019-07-23 MED ORDER — BD PEN NEEDLE NANO U/F 32G X 4 MM MISC: 6 refills | Status: DC

## 2019-07-23 NOTE — Telephone Encounter (Signed)
Patient called requesting a prescription be sent for Victoza and Pin Needles to Pharmacy. Patient has stated that Victoza will need PA.  PHARMACY:  [id number] [place], [place] - [phone] AT [facility] OF [facility] & [address] Phone:  [phone]  Fax:  [phone]

## 2019-07-24 ENCOUNTER — Ambulatory Visit: Payer: BC Managed Care – PPO

## 2019-07-24 MED ORDER — PEN NEEDLES 32G X 4 MM MISC: 1.0000 | 11 refills | Status: DC

## 2019-07-24 NOTE — Telephone Encounter (Signed)
After reading last OV note would you like for me to start a PA for victoza?

## 2019-07-24 NOTE — Telephone Encounter (Signed)
PA for victoza began on cover my meds Logan Davis (Key: [id number]) Victoza 18MG /3ML pen-injectors

## 2019-07-25 DIAGNOSIS — M5412 Radiculopathy, cervical region: Secondary | ICD-10-CM | POA: Diagnosis not present

## 2019-07-25 DIAGNOSIS — M25521 Pain in right elbow: Secondary | ICD-10-CM | POA: Diagnosis not present

## 2019-07-26 DIAGNOSIS — M2560 Stiffness of unspecified joint, not elsewhere classified: Secondary | ICD-10-CM | POA: Diagnosis not present

## 2019-07-26 DIAGNOSIS — M6281 Muscle weakness (generalized): Secondary | ICD-10-CM | POA: Diagnosis not present

## 2019-07-26 DIAGNOSIS — M5412 Radiculopathy, cervical region: Secondary | ICD-10-CM | POA: Diagnosis not present

## 2019-07-27 DIAGNOSIS — M2560 Stiffness of unspecified joint, not elsewhere classified: Secondary | ICD-10-CM | POA: Diagnosis not present

## 2019-07-27 DIAGNOSIS — M5412 Radiculopathy, cervical region: Secondary | ICD-10-CM | POA: Diagnosis not present

## 2019-07-27 DIAGNOSIS — M6281 Muscle weakness (generalized): Secondary | ICD-10-CM | POA: Diagnosis not present

## 2019-07-30 DIAGNOSIS — M2560 Stiffness of unspecified joint, not elsewhere classified: Secondary | ICD-10-CM | POA: Diagnosis not present

## 2019-07-30 DIAGNOSIS — M6281 Muscle weakness (generalized): Secondary | ICD-10-CM | POA: Diagnosis not present

## 2019-07-30 DIAGNOSIS — M5412 Radiculopathy, cervical region: Secondary | ICD-10-CM | POA: Diagnosis not present

## 2019-07-31 DIAGNOSIS — M2560 Stiffness of unspecified joint, not elsewhere classified: Secondary | ICD-10-CM | POA: Diagnosis not present

## 2019-07-31 DIAGNOSIS — M6281 Muscle weakness (generalized): Secondary | ICD-10-CM | POA: Diagnosis not present

## 2019-07-31 DIAGNOSIS — M5412 Radiculopathy, cervical region: Secondary | ICD-10-CM | POA: Diagnosis not present

## 2019-08-06 DIAGNOSIS — G9520 Unspecified cord compression: Secondary | ICD-10-CM | POA: Diagnosis not present

## 2019-08-06 DIAGNOSIS — M5412 Radiculopathy, cervical region: Secondary | ICD-10-CM | POA: Diagnosis not present

## 2019-08-06 DIAGNOSIS — M4802 Spinal stenosis, cervical region: Secondary | ICD-10-CM | POA: Diagnosis not present

## 2019-08-06 DIAGNOSIS — M6281 Muscle weakness (generalized): Secondary | ICD-10-CM | POA: Diagnosis not present

## 2019-08-06 DIAGNOSIS — M2560 Stiffness of unspecified joint, not elsewhere classified: Secondary | ICD-10-CM | POA: Diagnosis not present

## 2019-08-06 DIAGNOSIS — M5030 Other cervical disc degeneration, unspecified cervical region: Secondary | ICD-10-CM | POA: Diagnosis not present

## 2019-08-06 NOTE — Telephone Encounter (Signed)
New PA began [id number] cover my meds

## 2019-08-07 DIAGNOSIS — M6281 Muscle weakness (generalized): Secondary | ICD-10-CM | POA: Diagnosis not present

## 2019-08-07 DIAGNOSIS — M5412 Radiculopathy, cervical region: Secondary | ICD-10-CM | POA: Diagnosis not present

## 2019-08-07 DIAGNOSIS — M2560 Stiffness of unspecified joint, not elsewhere classified: Secondary | ICD-10-CM | POA: Diagnosis not present

## 2019-08-10 ENCOUNTER — Encounter: Payer: BC Managed Care – PPO | Attending: Internal Medicine | Admitting: Dietician

## 2019-08-14 DIAGNOSIS — M5412 Radiculopathy, cervical region: Secondary | ICD-10-CM | POA: Diagnosis not present

## 2019-08-14 DIAGNOSIS — M6281 Muscle weakness (generalized): Secondary | ICD-10-CM | POA: Diagnosis not present

## 2019-08-14 DIAGNOSIS — M2560 Stiffness of unspecified joint, not elsewhere classified: Secondary | ICD-10-CM | POA: Diagnosis not present

## 2019-08-15 DIAGNOSIS — M5412 Radiculopathy, cervical region: Secondary | ICD-10-CM | POA: Diagnosis not present

## 2019-08-15 DIAGNOSIS — M6281 Muscle weakness (generalized): Secondary | ICD-10-CM | POA: Diagnosis not present

## 2019-08-15 DIAGNOSIS — M2560 Stiffness of unspecified joint, not elsewhere classified: Secondary | ICD-10-CM | POA: Diagnosis not present

## 2019-08-20 DIAGNOSIS — M6281 Muscle weakness (generalized): Secondary | ICD-10-CM | POA: Diagnosis not present

## 2019-08-20 DIAGNOSIS — M5412 Radiculopathy, cervical region: Secondary | ICD-10-CM | POA: Diagnosis not present

## 2019-08-20 DIAGNOSIS — M2560 Stiffness of unspecified joint, not elsewhere classified: Secondary | ICD-10-CM | POA: Diagnosis not present

## 2019-08-21 DIAGNOSIS — M25721 Osteophyte, right elbow: Secondary | ICD-10-CM | POA: Diagnosis not present

## 2019-08-21 DIAGNOSIS — M25521 Pain in right elbow: Secondary | ICD-10-CM | POA: Diagnosis not present

## 2019-08-21 DIAGNOSIS — M2560 Stiffness of unspecified joint, not elsewhere classified: Secondary | ICD-10-CM | POA: Diagnosis not present

## 2019-08-21 DIAGNOSIS — M6281 Muscle weakness (generalized): Secondary | ICD-10-CM | POA: Diagnosis not present

## 2019-08-21 DIAGNOSIS — M7989 Other specified soft tissue disorders: Secondary | ICD-10-CM | POA: Diagnosis not present

## 2019-08-21 DIAGNOSIS — M5412 Radiculopathy, cervical region: Secondary | ICD-10-CM | POA: Diagnosis not present

## 2019-08-21 DIAGNOSIS — M249 Joint derangement, unspecified: Secondary | ICD-10-CM | POA: Diagnosis not present

## 2019-08-28 DIAGNOSIS — M2560 Stiffness of unspecified joint, not elsewhere classified: Secondary | ICD-10-CM | POA: Diagnosis not present

## 2019-08-28 DIAGNOSIS — M5412 Radiculopathy, cervical region: Secondary | ICD-10-CM | POA: Diagnosis not present

## 2019-08-28 DIAGNOSIS — M6281 Muscle weakness (generalized): Secondary | ICD-10-CM | POA: Diagnosis not present

## 2019-08-29 DIAGNOSIS — M5412 Radiculopathy, cervical region: Secondary | ICD-10-CM | POA: Diagnosis not present

## 2019-08-29 DIAGNOSIS — M6281 Muscle weakness (generalized): Secondary | ICD-10-CM | POA: Diagnosis not present

## 2019-08-29 DIAGNOSIS — M2560 Stiffness of unspecified joint, not elsewhere classified: Secondary | ICD-10-CM | POA: Diagnosis not present

## 2019-09-04 DIAGNOSIS — M6281 Muscle weakness (generalized): Secondary | ICD-10-CM | POA: Diagnosis not present

## 2019-09-04 DIAGNOSIS — M5412 Radiculopathy, cervical region: Secondary | ICD-10-CM | POA: Diagnosis not present

## 2019-09-04 DIAGNOSIS — M2560 Stiffness of unspecified joint, not elsewhere classified: Secondary | ICD-10-CM | POA: Diagnosis not present

## 2019-09-05 ENCOUNTER — Other Ambulatory Visit: Payer: Self-pay

## 2019-09-05 MED ORDER — TRULICITY 1.5 MG/0.5ML ~~LOC~~ SOAJ: 1.5000 mg | SUBCUTANEOUS | 11 refills | Status: DC

## 2019-09-05 NOTE — Telephone Encounter (Signed)
I changed it to trulicity 1.5 mg weekly. Please let him know this is to be taken weekly rather then daily    Thanks

## 2019-09-05 NOTE — Telephone Encounter (Signed)
Spoke to pt and wife and they are aware of changes.

## 2019-09-05 NOTE — Telephone Encounter (Signed)
Preferred medication is byderon, trulicity, ozempic, and byetta in placed of victoza. Please advise.

## 2019-09-05 NOTE — Addendum Note (Signed)
Addended by: Scarlette Shorts on: 09/05/2019 03:55 PM   Modules accepted: Orders

## 2019-09-06 DIAGNOSIS — M5412 Radiculopathy, cervical region: Secondary | ICD-10-CM | POA: Diagnosis not present

## 2019-09-06 DIAGNOSIS — M2560 Stiffness of unspecified joint, not elsewhere classified: Secondary | ICD-10-CM | POA: Diagnosis not present

## 2019-09-06 DIAGNOSIS — M6281 Muscle weakness (generalized): Secondary | ICD-10-CM | POA: Diagnosis not present

## 2019-09-11 DIAGNOSIS — M2560 Stiffness of unspecified joint, not elsewhere classified: Secondary | ICD-10-CM | POA: Diagnosis not present

## 2019-09-11 DIAGNOSIS — M5412 Radiculopathy, cervical region: Secondary | ICD-10-CM | POA: Diagnosis not present

## 2019-09-11 DIAGNOSIS — M6281 Muscle weakness (generalized): Secondary | ICD-10-CM | POA: Diagnosis not present

## 2019-09-13 DIAGNOSIS — M6281 Muscle weakness (generalized): Secondary | ICD-10-CM | POA: Diagnosis not present

## 2019-09-13 DIAGNOSIS — M2560 Stiffness of unspecified joint, not elsewhere classified: Secondary | ICD-10-CM | POA: Diagnosis not present

## 2019-09-13 DIAGNOSIS — M67823 Other specified disorders of tendon, right elbow: Secondary | ICD-10-CM | POA: Diagnosis not present

## 2019-09-13 DIAGNOSIS — M5412 Radiculopathy, cervical region: Secondary | ICD-10-CM | POA: Diagnosis not present

## 2019-09-13 DIAGNOSIS — M778 Other enthesopathies, not elsewhere classified: Secondary | ICD-10-CM | POA: Diagnosis not present

## 2019-09-13 DIAGNOSIS — M67931 Unspecified disorder of synovium and tendon, right forearm: Secondary | ICD-10-CM | POA: Diagnosis not present

## 2019-09-17 DIAGNOSIS — G9520 Unspecified cord compression: Secondary | ICD-10-CM | POA: Diagnosis not present

## 2019-09-17 DIAGNOSIS — M5030 Other cervical disc degeneration, unspecified cervical region: Secondary | ICD-10-CM | POA: Diagnosis not present

## 2019-09-17 DIAGNOSIS — M4802 Spinal stenosis, cervical region: Secondary | ICD-10-CM | POA: Diagnosis not present

## 2019-09-17 DIAGNOSIS — M5412 Radiculopathy, cervical region: Secondary | ICD-10-CM | POA: Diagnosis not present

## 2019-09-17 DIAGNOSIS — I10 Essential (primary) hypertension: Secondary | ICD-10-CM | POA: Diagnosis not present

## 2019-09-18 DIAGNOSIS — M2560 Stiffness of unspecified joint, not elsewhere classified: Secondary | ICD-10-CM | POA: Diagnosis not present

## 2019-09-18 DIAGNOSIS — M6281 Muscle weakness (generalized): Secondary | ICD-10-CM | POA: Diagnosis not present

## 2019-09-18 DIAGNOSIS — M5412 Radiculopathy, cervical region: Secondary | ICD-10-CM | POA: Diagnosis not present

## 2019-09-21 DIAGNOSIS — M5412 Radiculopathy, cervical region: Secondary | ICD-10-CM | POA: Diagnosis not present

## 2019-09-21 DIAGNOSIS — M2560 Stiffness of unspecified joint, not elsewhere classified: Secondary | ICD-10-CM | POA: Diagnosis not present

## 2019-09-21 DIAGNOSIS — M6281 Muscle weakness (generalized): Secondary | ICD-10-CM | POA: Diagnosis not present

## 2019-09-24 DIAGNOSIS — I10 Essential (primary) hypertension: Secondary | ICD-10-CM | POA: Diagnosis not present

## 2019-09-24 DIAGNOSIS — S46311D Strain of muscle, fascia and tendon of triceps, right arm, subsequent encounter: Secondary | ICD-10-CM | POA: Diagnosis not present

## 2019-09-25 DIAGNOSIS — M5412 Radiculopathy, cervical region: Secondary | ICD-10-CM | POA: Diagnosis not present

## 2019-09-25 DIAGNOSIS — M2560 Stiffness of unspecified joint, not elsewhere classified: Secondary | ICD-10-CM | POA: Diagnosis not present

## 2019-09-25 DIAGNOSIS — M6281 Muscle weakness (generalized): Secondary | ICD-10-CM | POA: Diagnosis not present

## 2019-09-27 DIAGNOSIS — M6281 Muscle weakness (generalized): Secondary | ICD-10-CM | POA: Diagnosis not present

## 2019-09-27 DIAGNOSIS — M2560 Stiffness of unspecified joint, not elsewhere classified: Secondary | ICD-10-CM | POA: Diagnosis not present

## 2019-09-27 DIAGNOSIS — M5412 Radiculopathy, cervical region: Secondary | ICD-10-CM | POA: Diagnosis not present

## 2019-10-04 ENCOUNTER — Other Ambulatory Visit: Payer: Self-pay

## 2019-10-04 ENCOUNTER — Telehealth: Payer: Self-pay | Admitting: Internal Medicine

## 2019-10-04 DIAGNOSIS — M2560 Stiffness of unspecified joint, not elsewhere classified: Secondary | ICD-10-CM | POA: Diagnosis not present

## 2019-10-04 DIAGNOSIS — M5412 Radiculopathy, cervical region: Secondary | ICD-10-CM | POA: Diagnosis not present

## 2019-10-04 DIAGNOSIS — M6281 Muscle weakness (generalized): Secondary | ICD-10-CM | POA: Diagnosis not present

## 2019-10-04 MED ORDER — INSULIN GLARGINE 100 UNIT/ML ~~LOC~~ SOLN: 20.0000 [IU] | Freq: Every day | SUBCUTANEOUS | 3 refills | Status: DC

## 2019-10-04 NOTE — Telephone Encounter (Signed)
MEDICATION: trulicity and lantus  PHARMACY:   [id number] [place],  - [phone] AT [facility] OF [facility] & [address] Phone:  [phone]  Fax:  [phone]      IS THIS A 90 DAY SUPPLY : yes  IS PATIENT OUT OF MEDICATION: trulicity no / lantus no  IF NOT; HOW MUCH IS LEFT: one more of each  LAST APPOINTMENT DATE: 07/09/2019  NEXT APPOINTMENT DATE: @4 /13/2021  DO WE HAVE YOUR PERMISSION TO LEAVE A DETAILED MESSAGE: yes  OTHER COMMENTS: Patient called stating she needed a new RX for each one sent but both need to be 4 quantity and 90 day supplies due to cost purposes.    **Let patient know to contact pharmacy at the end of the day to make sure medication is ready. **  ** Please notify patient to allow 48-72 hours to process**  **Encourage patient to contact the pharmacy for refills or they can request refills through [facility]**

## 2019-10-04 NOTE — Telephone Encounter (Signed)
Trulicity was sent 09/05/2019 and sent lantus today

## 2019-10-05 DIAGNOSIS — M5412 Radiculopathy, cervical region: Secondary | ICD-10-CM | POA: Diagnosis not present

## 2019-10-05 DIAGNOSIS — M2560 Stiffness of unspecified joint, not elsewhere classified: Secondary | ICD-10-CM | POA: Diagnosis not present

## 2019-10-05 DIAGNOSIS — M6281 Muscle weakness (generalized): Secondary | ICD-10-CM | POA: Diagnosis not present

## 2019-10-09 ENCOUNTER — Ambulatory Visit: Payer: BC Managed Care – PPO | Admitting: Internal Medicine

## 2019-10-09 NOTE — Progress Notes (Deleted)
Name: Logan Davis  Age/ Sex: 54 y.o., male   MRN/ DOB: [id number], [birth year]     PCP: Rosemary Holms   Reason for Endocrinology Evaluation: Type 2 Diabetes Mellitus     Initial Endocrinology Clinic Visit: 09/11/2018    PATIENT IDENTIFIER: Logan Davis is a 54 y.o. male with a past medical history of HTN, dyslipidemia and T2DM. The patient has followed with Endocrinology clinic since 09/11/2018 for consultative assistance with management of his diabetes.  DIABETIC HISTORY:  Logan Davis was diagnosed with T2DM in  2018. He has reported intolerance to Metformin. He has tried [place] in the past without reported intolerance. Insulin has been started shortly after diagnosis.  His hemoglobin A1c has ranged from 8.7% in 2019, peaking at > 15.5% in 2018.   On his initial visit to our clinic his A1c was 13.0 %. He was on Farxiga (but had stopped it a month prior to his presentation),so we stopped it,  he was skipping lantus ~ 3-4 x a week . He was also on Victoza. We attempted to switch Victoza to trulicity but he opted to continue with Victoza.  In 11/2018 we started Marcelline Deist but he stopped taking it on his own 06/2019    Lantus and victoza stopped in 11/2018. Replaced by farxiga and trulicity  SUBJECTIVE:   During the last visit (07/09/2019): A1c 7.9 %. He was not taking his meds, we restarted metformin, farxiga, victoza and lantus     Today (10/09/2019): Logan Davis is here for a 3 month follow up on his diabetes management.  He checks his blood sugars 0 times daily for the past. The patient has not had hypoglycemic episodes since the last clinic visit. 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 Otherwise, the patient has not required any recent emergency interventions for hypoglycemia and has not had recent hospitalizations secondary to hyper or hypoglycemic episodes.   He has not been taking farxiga in 2-3 months ago, stopped lantus and victoza in the past 2 weeks  because he was at the beach .   ROS: As per HPI and as detailed below: Review of Systems  Constitutional: Negative for fever.  HENT: Negative for congestion and sore throat.   Cardiovascular: Negative for chest pain and palpitations.  Gastrointestinal: Negative for diarrhea and nausea.      HOME DIABETES REGIMEN:  Metformin 500 mg XR 1 tab daily  Farxiga 10 mg daily  Victoza 1.8 mg  Lantus 20 units   METER DOWNLOAD SUMMARY: did not bring meter     HISTORY:  Past Medical History:  Past Medical History:  Diagnosis Date  . Diabetes mellitus without complication (HCC)   . Hyperlipidemia   . Hypertension     Past Surgical History: No past surgical history on file.  Social History:  reports that he has never smoked. He has never used smokeless tobacco. He reports that he does not drink alcohol or use drugs. Family History:  Family History  Problem Relation Age of Onset  . Diabetes Mother   . Hypertension Mother   . Hypertension Father   . Diabetes Father   . Diabetes Sister   . Hypertension Sister   . Hyperlipidemia Sister      HOME MEDICATIONS: Allergies as of 10/09/2019      Reactions   Sulfa Antibiotics Anaphylaxis      Medication List       Accurate as of October 09, 2019 12:55  PM. If you have any questions, ask your nurse or doctor.        atorvastatin 20 MG tablet Commonly known as: LIPITOR Take 1 tablet (20 mg total) by mouth daily.   dapagliflozin propanediol 10 MG Tabs tablet Commonly known as: Farxiga Take 10 mg by mouth daily.   insulin glargine 100 UNIT/ML injection Commonly known as: LANTUS Inject 0.2 mLs (20 Units total) into the skin daily.   LIFESCAN FINEPOINT LANCETS Misc Use to check blood sugar 3 time(s) daily   losartan 50 MG tablet Commonly known as: COZAAR 100 mg.   metFORMIN 500 MG 24 hr tablet Commonly known as: GLUCOPHAGE-XR Take 2 tablets (1,000 mg total) by mouth 2 (two) times a day.   OneTouch Verio test  strip Generic drug: glucose blood Twice daily   Pen Needles 32G X 4 MM Misc 1 Device by Does not apply route as directed.   Trulicity 1.5 MG/0.5ML Sopn Generic drug: Dulaglutide Inject 1.5 mg into the skin once a week.         DATA REVIEWED: 06/26/2018  BUN/Cr 16/1.44 mg/dL GFR 65 L2G 40.1 %     Results for Logan Davis, Logan Davis (MRN [id number]) as of 07/11/2019 08:15  Ref. Range 07/09/2019 16:43  Creatinine,U Latest Units: mg/dL 403.4  Microalb, Ur Latest Ref Range: 0.0 - 1.9 mg/dL 4.4 (H)  MICROALB/CREAT RATIO Latest Ref Range: 0.0 - 30.0 mg/g 0.9    In Office BG 290 mg/dL  ASSESSMENT / PLAN / RECOMMENDATIONS:   1) Type 2 Diabetes Mellitus, Sub-Optimally Controlled, Without  complications - Most recent A1c of 7.9 %. Goal A1c < 7.0%   - Poorly controlled diabetes due to continued medication non-adherence and dietary indiscretions. - He stopped taking Marcelline Deist because his sugars "have been good" I explained to him that his glucose readings are good because he was taking [place]. We discussed the cardiovascular, glucose lowering benefits and weight loss benefits with [place]. He is intolerant to higher doses of metformin.  - He has not taking lantus and victoza in 2 weeks, he went to the beach and did not refill prior to his trip. - We again discussed the importance of medication adherence and dietary indiscretions. He started a church fast last week, eats from 7AM to 7PM, no meat, but has been eating mainly  CHO but not a lot of protein. His In-office was 290 mg/dL , this is after eating 3 bananas and chips prior to coming here. Pt will be referred to our RD ( he had declined in the past) but today we discussed the importance of dietary education.     MEDICATIONS:  Metformin 500 mg XR 1 tab daily   Restart Farxiga 10 mg daily   Restart Victoza 1.8 mg daily   Restart Lantus and reduce to 20 units   EDUCATION / INSTRUCTIONS:  BG monitoring instructions: Patient is  instructed to check his blood sugars 2 times a day, fasting and bedtime  Call [facility] if: BG persistently < 70 or > 300. . I reviewed the Rule of 15 for the treatment of hypoglycemia in detail with the patient. Literature supplied.    2) Diabetic complications:   Eye: Does not have known diabetic retinopathy.   Neuro/ Feet: Does not have known diabetic peripheral neuropathy.  Renal: Patient does not have known baseline CKD. He is  on an ACEI/ARB at present. Microalbumin      F/U in 3 months    Signed electronically by: Lamount Cranker  Kelton Pillar, MD  [facility]  [employer] [address] [place], [place] [address] Phone: [phone] FAX: [phone]   CC: Loyola Mast, PA-C [place] [address] Phone: [phone]  Fax: [phone]  Return to Endocrinology clinic as below: Future Appointments  Date Time Provider [place]  10/09/2019  2:20 PM Vannah Nadal, Melanie Crazier, MD [facility] None

## 2019-10-11 ENCOUNTER — Other Ambulatory Visit: Payer: Self-pay

## 2019-10-11 ENCOUNTER — Other Ambulatory Visit: Payer: Self-pay | Admitting: Internal Medicine

## 2019-10-11 MED ORDER — LANTUS SOLOSTAR 100 UNIT/ML ~~LOC~~ SOPN: 20.0000 [IU] | PEN_INJECTOR | Freq: Every day | SUBCUTANEOUS | 3 refills | Status: DC

## 2019-10-11 MED ORDER — INSULIN GLARGINE 100 UNITS/ML SOLOSTAR PEN: 20.0000 [IU] | PEN_INJECTOR | Freq: Every day | SUBCUTANEOUS | 11 refills | Status: DC

## 2019-10-11 NOTE — Telephone Encounter (Signed)
Spoke to pt wife and informed that the rx should be corrected with the pharmacy.

## 2019-10-11 NOTE — Telephone Encounter (Signed)
Patient called stating the Lantus that was called in needed to be pens instead of valves. Patient requests a phone call to make sure it gets called in correctly....ph# [phone]

## 2019-10-17 ENCOUNTER — Ambulatory Visit: Payer: BC Managed Care – PPO | Admitting: Internal Medicine

## 2019-10-18 DIAGNOSIS — M5412 Radiculopathy, cervical region: Secondary | ICD-10-CM | POA: Diagnosis not present

## 2019-10-18 DIAGNOSIS — M2560 Stiffness of unspecified joint, not elsewhere classified: Secondary | ICD-10-CM | POA: Diagnosis not present

## 2019-10-18 DIAGNOSIS — M6281 Muscle weakness (generalized): Secondary | ICD-10-CM | POA: Diagnosis not present

## 2019-10-22 ENCOUNTER — Other Ambulatory Visit: Payer: Self-pay

## 2019-10-22 ENCOUNTER — Ambulatory Visit: Payer: BC Managed Care – PPO | Admitting: Internal Medicine

## 2019-10-22 ENCOUNTER — Encounter: Payer: Self-pay | Admitting: Internal Medicine

## 2019-10-22 VITALS — BP 124/80 | HR 67 | Temp 97.8°F | Ht 67.0 in | Wt 212.8 lb

## 2019-10-22 DIAGNOSIS — E119 Type 2 diabetes mellitus without complications: Secondary | ICD-10-CM | POA: Diagnosis not present

## 2019-10-22 LAB — POCT GLYCOSYLATED HEMOGLOBIN (HGB A1C): Hemoglobin A1C: 6.8 % — AB (ref 4.0–5.6)

## 2019-10-22 LAB — GLUCOSE, POCT (MANUAL RESULT ENTRY): POC Glucose: 110 mg/dl — AB (ref 70–99)

## 2019-10-22 NOTE — Patient Instructions (Addendum)
-   Keep Up the Good Work, your A1c is at goal  !!!  - Continue  Metformin 500 mg twice daily  - Continue  Farxiga 10 mg daily  - Continue trulicity 1.5 mg weekly  - You can hold off on the Lantus for now , but if you notice that your fasting sugars are continuously above 180 mg/dl ,please restart        - HOW TO TREAT LOW BLOOD SUGARS (Blood sugar LESS THAN 70 MG/DL)  Please follow the RULE OF 15 for the treatment of hypoglycemia treatment (when your (blood sugars are less than 70 mg/dL)    STEP 1: Take 15 grams of carbohydrates when your blood sugar is low, which includes:   3-4 GLUCOSE TABS  OR  3-4 OZ OF JUICE OR REGULAR SODA OR  ONE TUBE OF GLUCOSE GEL     STEP 2: RECHECK blood sugar in 15 MINUTES STEP 3: If your blood sugar is still low at the 15 minute recheck --> then, go back to STEP 1 and treat AGAIN with another 15 grams of carbohydrates.

## 2019-10-22 NOTE — Progress Notes (Signed)
Name: Logan Davis  Age/ Sex: 54 y.o., male   MRN/ DOB: [id number], September 02, 1965     PCP: Rosemary Holms   Reason for Endocrinology Evaluation: Type 2 Diabetes Mellitus     Initial Endocrinology Clinic Visit: 09/11/2018    PATIENT IDENTIFIER: [name] is a 54 y.o. male with a past medical history of HTN, dyslipidemia and T2DM. The patient has followed with Endocrinology clinic since 09/11/2018 for consultative assistance with management of his diabetes.  DIABETIC HISTORY:  Logan Davis was diagnosed with T2DM in  2018. He has reported intolerance to Metformin. He has tried [place] in the past without reported intolerance. Insulin has been started shortly after diagnosis.  His hemoglobin A1c has ranged from 8.7% in 2019, peaking at > 15.5% in 2018.   On his initial visit to our clinic his A1c was 13.0 %. He was on Farxiga (but had stopped it a month prior to his presentation),so we stopped it,  he was skipping lantus ~ 3-4 x a week . He was also on Victoza. We attempted to switch Victoza to trulicity but he opted to continue with Victoza.  In 11/2018 we started Marcelline Deist but he stopped taking it on his own 06/2019    Lantus and victoza stopped in 11/2018. Replaced by farxiga and trulicity  SUBJECTIVE:   During the last visit (07/09/2019): A1c 7.9 %. He was not taking his meds, we restarted metformin, farxiga, victoza and lantus     Today (10/22/2019): Logan Davis is here for a 3 month follow up on his diabetes management.  He checks his blood sugars 0 times daily for the past. The patient has not had hypoglycemic episodes since the last clinic visit.  He stopped using Lantus a couple weeks ago, patient does not know why, but he denies hypoglycemia.   ROS: As per HPI and as detailed below: Review of Systems  HENT: Negative for congestion and sore throat.   Gastrointestinal: Negative for diarrhea and nausea.      HOME DIABETES REGIMEN:  Metformin 500 mg XR 1 tab BID  Farxiga 10 mg daily  Trulicity 1.5 mg weekly  Lantus 20 units -  Has not taken it in 2 weeks     METER DOWNLOAD SUMMARY: did not bring meter     HISTORY:  Past Medical History:  Past Medical History:  Diagnosis Date  . Diabetes mellitus without complication (HCC)   . Hyperlipidemia   . Hypertension     Past Surgical History: No past surgical history on file.  Social History:  reports that he has never smoked. He has never used smokeless tobacco. He reports that he does not drink alcohol or use drugs. Family History:  Family History  Problem Relation Age of Onset  . Diabetes Mother   . Hypertension Mother   . Hypertension Father   . Diabetes Father   . Diabetes Sister   . Hypertension Sister   . Hyperlipidemia Sister      HOME MEDICATIONS: Allergies as of 10/22/2019      Reactions   Sulfa Antibiotics Anaphylaxis      Medication List       Accurate as of October 22, 2019  7:34 AM. If you have any questions, ask your nurse or doctor.        atorvastatin 20 MG tablet Commonly known as: LIPITOR Take 1 tablet (20 mg total) by mouth daily.   dapagliflozin propanediol 10 MG Tabs tablet Commonly known  asWilder Glade Take 10 mg by mouth daily.   Lantus SoloStar 100 UNIT/ML Solostar Pen Generic drug: insulin glargine Inject 20 Units into the skin daily.   LIFESCAN FINEPOINT LANCETS Misc Use to check blood sugar 3 time(s) daily   losartan 50 MG tablet Commonly known as: COZAAR 100 mg.   metFORMIN 500 MG 24 hr tablet Commonly known as: GLUCOPHAGE-XR Take 2 tablets (1,000 mg total) by mouth 2 (two) times a day.   OneTouch Verio test strip Generic drug: glucose blood Twice daily   Pen Needles 32G X 4 MM Misc 1 Device by Does not apply route as directed.   Trulicity 1.5 CZ/6.6AY Sopn Generic drug: Dulaglutide Inject 1.5 mg into the skin once a week.         DATA REVIEWED: 06/26/2018  BUN/Cr 16/1.44 mg/dL GFR 65 A1c 13.0 %     Results for  Logan Davis, Logan Davis (MRN [id number]) as of 07/11/2019 08:15  Ref. Range 07/09/2019 16:43  Creatinine,U Latest Units: mg/dL 483.4  Microalb, Ur Latest Ref Range: 0.0 - 1.9 mg/dL 4.4 (H)  MICROALB/CREAT RATIO Latest Ref Range: 0.0 - 30.0 mg/g 0.9    In Office BG 110 mg/dL  ASSESSMENT / PLAN / RECOMMENDATIONS:   1) Type 2 Diabetes Mellitus, Optimally Controlled, Without  complications - Most recent A1c of 6.8 %. Goal A1c < 7.0%  -A1c at goal -Great improvement in glycemic control, I have advised him to avoid Lantus at this time, but I have encouraged him to start glucose checks at home, and to restart Lantus should his fasting BG's remain above 180 MGs/DL.  MEDICATIONS:  Continue Metformin 500 mg XR twice daily  Continue Farxiga 10 mg daily   Continue Trulicity 1.5 mg weekly  Stop Lantus  EDUCATION / INSTRUCTIONS:  BG monitoring instructions: Patient is instructed to check his blood sugars 2 times a day, fasting and bedtime  Call [facility] if: BG persistently < 70 or > 300. . I reviewed the Rule of 15 for the treatment of hypoglycemia in detail with the patient. Literature supplied.    2) Diabetic complications:   Eye: Does not have known diabetic retinopathy.   Neuro/ Feet: Does not have known diabetic peripheral neuropathy.  Renal: Patient does not have known baseline CKD. He is  on an ACEI/ARB at present. Microalbumin      F/U in 6 months    Signed electronically by: Mack Guise, MD  [facility]  [address] [place], [place] [address] Phone: [phone] FAX: [phone]   CC: Loyola Mast, PA-C [place] [address] Phone: [phone]  Fax: [phone]  Return to Endocrinology clinic as below: Future Appointments  Date Time Provider [place]  10/22/2019  8:50 AM Tena Linebaugh, Melanie Crazier, MD [facility] None

## 2019-10-25 DIAGNOSIS — M6281 Muscle weakness (generalized): Secondary | ICD-10-CM | POA: Diagnosis not present

## 2019-10-25 DIAGNOSIS — M2560 Stiffness of unspecified joint, not elsewhere classified: Secondary | ICD-10-CM | POA: Diagnosis not present

## 2019-10-25 DIAGNOSIS — M5412 Radiculopathy, cervical region: Secondary | ICD-10-CM | POA: Diagnosis not present

## 2019-10-31 DIAGNOSIS — M2560 Stiffness of unspecified joint, not elsewhere classified: Secondary | ICD-10-CM | POA: Diagnosis not present

## 2019-10-31 DIAGNOSIS — M5412 Radiculopathy, cervical region: Secondary | ICD-10-CM | POA: Diagnosis not present

## 2019-10-31 DIAGNOSIS — M6281 Muscle weakness (generalized): Secondary | ICD-10-CM | POA: Diagnosis not present

## 2019-11-06 DIAGNOSIS — M6281 Muscle weakness (generalized): Secondary | ICD-10-CM | POA: Diagnosis not present

## 2019-11-06 DIAGNOSIS — M5412 Radiculopathy, cervical region: Secondary | ICD-10-CM | POA: Diagnosis not present

## 2019-11-06 DIAGNOSIS — M2560 Stiffness of unspecified joint, not elsewhere classified: Secondary | ICD-10-CM | POA: Diagnosis not present

## 2019-11-16 DIAGNOSIS — M6281 Muscle weakness (generalized): Secondary | ICD-10-CM | POA: Diagnosis not present

## 2019-11-16 DIAGNOSIS — M2560 Stiffness of unspecified joint, not elsewhere classified: Secondary | ICD-10-CM | POA: Diagnosis not present

## 2019-11-16 DIAGNOSIS — M5412 Radiculopathy, cervical region: Secondary | ICD-10-CM | POA: Diagnosis not present

## 2019-12-05 ENCOUNTER — Other Ambulatory Visit: Payer: Self-pay | Admitting: Internal Medicine

## 2019-12-06 ENCOUNTER — Encounter: Payer: Self-pay | Admitting: Internal Medicine

## 2019-12-06 DIAGNOSIS — Z0279 Encounter for issue of other medical certificate: Secondary | ICD-10-CM

## 2019-12-20 DIAGNOSIS — I10 Essential (primary) hypertension: Secondary | ICD-10-CM | POA: Diagnosis not present

## 2019-12-20 DIAGNOSIS — Z1211 Encounter for screening for malignant neoplasm of colon: Secondary | ICD-10-CM | POA: Diagnosis not present

## 2019-12-20 DIAGNOSIS — G540 Brachial plexus disorders: Secondary | ICD-10-CM | POA: Diagnosis not present

## 2019-12-20 DIAGNOSIS — G5601 Carpal tunnel syndrome, right upper limb: Secondary | ICD-10-CM | POA: Diagnosis not present

## 2019-12-20 DIAGNOSIS — M542 Cervicalgia: Secondary | ICD-10-CM | POA: Diagnosis not present

## 2019-12-24 ENCOUNTER — Encounter: Payer: Self-pay | Admitting: Podiatry

## 2019-12-24 ENCOUNTER — Ambulatory Visit: Payer: BC Managed Care – PPO | Admitting: Podiatry

## 2019-12-24 ENCOUNTER — Other Ambulatory Visit: Payer: Self-pay

## 2019-12-24 ENCOUNTER — Ambulatory Visit (INDEPENDENT_AMBULATORY_CARE_PROVIDER_SITE_OTHER): Payer: BC Managed Care – PPO

## 2019-12-24 VITALS — Temp 97.1°F

## 2019-12-24 DIAGNOSIS — M5416 Radiculopathy, lumbar region: Secondary | ICD-10-CM | POA: Diagnosis not present

## 2019-12-24 DIAGNOSIS — I10 Essential (primary) hypertension: Secondary | ICD-10-CM | POA: Diagnosis not present

## 2019-12-24 DIAGNOSIS — M5030 Other cervical disc degeneration, unspecified cervical region: Secondary | ICD-10-CM | POA: Diagnosis not present

## 2019-12-24 DIAGNOSIS — G9520 Unspecified cord compression: Secondary | ICD-10-CM | POA: Diagnosis not present

## 2019-12-24 DIAGNOSIS — G5761 Lesion of plantar nerve, right lower limb: Secondary | ICD-10-CM

## 2019-12-24 DIAGNOSIS — M79671 Pain in right foot: Secondary | ICD-10-CM | POA: Diagnosis not present

## 2019-12-24 DIAGNOSIS — M4802 Spinal stenosis, cervical region: Secondary | ICD-10-CM | POA: Diagnosis not present

## 2019-12-24 DIAGNOSIS — M722 Plantar fascial fibromatosis: Secondary | ICD-10-CM

## 2019-12-24 DIAGNOSIS — M5412 Radiculopathy, cervical region: Secondary | ICD-10-CM | POA: Diagnosis not present

## 2019-12-24 MED ORDER — MELOXICAM 15 MG PO TABS: 15.0000 mg | ORAL_TABLET | Freq: Every day | ORAL | 3 refills | Status: DC

## 2019-12-24 MED ORDER — MELOXICAM 15 MG PO TABS
15.0000 mg | ORAL_TABLET | Freq: Every day | ORAL | 3 refills | Status: DC
Start: 2019-12-24 — End: 2020-04-11

## 2019-12-24 NOTE — Progress Notes (Signed)
Subjective:  Patient ID: Logan Davis, male    DOB: 30-Dec-1965,  MRN: [id number]  Chief Complaint  Patient presents with  . Foot Pain    R foot, bottom of heel and plantar midfoot. x1+ yr. Pt stated, "I fell backwards onto concrete and hit my head. Ever since then, I've had different areas of burning pain on the R side of my body. No diagnosis of diabetic neuropathy. I've had neck surgery. When I take the muscle relaxer for my neck, my foot pain improves. It's not really pain - it's a burning sensation. I twisted my foot (laterally) after the fall, too. Ankle brace helps".    54 y.o. male presents with the above complaint.  Pain is primarily in the plantar right heel, present since his injury.  He has a history of cervical spine injury and issues with right upper extremity problems.  He sees Dr. Petra Kuba at [facility] health spine for this.  His right upper extremity problems did resolve after his spine surgery last year.  He notes burning tingling pain in the right thigh and leg.  He takes muscle relaxers for his neck which were helpful for his heel.  No treatment for the heel specifically thus far.   Review of Systems: Negative except as noted in the HPI. Denies N/V/F/Ch.  Past Medical History:  Diagnosis Date  . Diabetes mellitus without complication (HCC)   . Hyperlipidemia   . Hypertension     Current Outpatient Medications:  .  atorvastatin (LIPITOR) 20 MG tablet, Take 1 tablet (20 mg total) by mouth daily., Disp: 90 tablet, Rfl: 3 .  Dulaglutide (TRULICITY) 1.5 MG/0.5ML SOPN, Inject 1.5 mg into the skin once a week., Disp: 4 pen, Rfl: 11 .  FARXIGA 10 MG TABS tablet, TAKE 1 TABLET BY MOUTH DAILY, Disp: 30 tablet, Rfl: 11 .  insulin glargine (LANTUS SOLOSTAR) 100 UNIT/ML Solostar Pen, Inject 20 Units into the skin daily., Disp: 30 mL, Rfl: 3 .  Insulin Pen Needle (PEN NEEDLES) 32G X 4 MM MISC, 1 Device by Does not apply route as directed., Disp: 100 each, Rfl: 11 .  LIFESCAN  FINEPOINT LANCETS MISC, Use to check blood sugar 3 time(s) daily, Disp: , Rfl:  .  losartan (COZAAR) 50 MG tablet, 100 mg. , Disp: , Rfl:  .  metFORMIN (GLUCOPHAGE-XR) 500 MG 24 hr tablet, Take 2 tablets (1,000 mg total) by mouth 2 (two) times a day. (Patient taking differently: Take 1,000 mg by mouth 2 (two) times a day. ), Disp: 180 tablet, Rfl: 6 .  ONETOUCH VERIO test strip, Twice daily, Disp: 100 each, Rfl: 6 .  meloxicam (MOBIC) 15 MG tablet, Take 1 tablet (15 mg total) by mouth daily., Disp: 30 tablet, Rfl: 3  Social History   Tobacco Use  Smoking Status Never Smoker  Smokeless Tobacco Never Used    Allergies  Allergen Reactions  . Sulfa Antibiotics Anaphylaxis   Objective:   Vitals:   12/24/19 1452  Temp: (!) 97.1 F (36.2 C)   There is no height or weight on file to calculate BMI. Constitutional Well developed. Well nourished.  Vascular Dorsalis pedis pulses palpable bilaterally. Posterior tibial pulses palpable bilaterally. Capillary refill normal to all digits.  No cyanosis or clubbing noted. Pedal hair growth normal.  Neurologic Normal speech. Oriented to person, place, and time. Epicritic sensation to light touch grossly present bilaterally.  Dermatologic Nails well groomed and normal in appearance. No open wounds. No skin lesions.  Orthopedic: Normal joint  ROM without pain or crepitus bilaterally. No visible deformities. Tender to palpation at the calcaneal tuber right. No pain with calcaneal squeeze right. Ankle ROM full range of motion right. Silf[place]old Test: negative right. Straight leg raise test was negative on the right side. Negative Tinel's right tarsal tunnel, the porta pedis, lateral plantar nerve.   Radiographs: Taken and reviewed. No acute fractures or dislocations. No evidence of stress fracture.  Plantar heel spur absent. Posterior heel spur present.   Assessment:   1. Right foot pain   2. 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 Lumbar radiculopathy    Plan:  Patient was evaluated and treated and all questions answered.  Plantar Fasciitis, right - XR reviewed as above.  - Educated on icing and stretching. Instructions given.  - Injection delivered to the plantar fascia as below. - DME: Plantar fascial brace dispensed. - Pharmacologic management: Meloxicam. Educated on risks/benefits and proper taking of medication.  -While I believe his main complaint is related to inflammatory plantar fasciitis on the right side, there is some component that appears to be related to his nerve system of his right lower extremity.  He does not appear to have evidence of tarsal tunnel syndrome.  It is possible there is entrapment of the lateral plantar nerve that does not developed a Tinel's sign to palpation.  He has a negative straight leg raise, however his complaint of burning pain in the right thigh and leg does concern me for lumbar radiculopathy given his history of spinal injury and cervical spine issues and surgery.  I would like him discussed with his spine surgeon.  I have also ordered electromyography and nerve conduction velocity studies to evaluate and elucidate the etiology of this pain.  Procedure: Injection Tendon/Ligament Location: Right plantar fascia at the glabrous junction; medial approach. Skin Prep: alcohol Injectate: 1 cc 0.5% marcaine plain, 1 cc Xylocaine plain,  1 cc kenalog 10. Disposition: Patient tolerated procedure well. Injection site dressed with a band-aid.  Return in about 6 weeks (around 02/04/2020) for plantar fasciitis, review of nerve studies.    Lanae Crumbly, DPM 12/24/2019

## 2019-12-24 NOTE — Patient Instructions (Signed)
I will order nerve testing on the right leg (EMG and NCV - electromyography and nerve conduction velocities) to evaluate if there is a nerve entrapment within the foot/ankle/leg, or if there is an impinged nerve exiting your lumbar spine and I encourage you to discuss this with Dr Katherine Roan      Plantar Fasciitis (Heel Spur Syndrome) with Rehab The plantar fascia is a fibrous, ligament-like, soft-tissue structure that spans the bottom of the foot. Plantar fasciitis is a condition that causes pain in the foot due to inflammation of the tissue. SYMPTOMS   Pain and tenderness on the underneath side of the foot.  Pain that worsens with standing or walking. CAUSES  Plantar fasciitis is caused by irritation and injury to the plantar fascia on the underneath side of the foot. Common mechanisms of injury include:  Direct trauma to bottom of the foot.  Damage to a small nerve that runs under the foot where the main fascia attaches to the heel bone.  Stress placed on the plantar fascia due to bone spurs. RISK INCREASES WITH:   Activities that place stress on the plantar fascia (running, jumping, pivoting, or cutting).  Poor strength and flexibility.  Improperly fitted shoes.  Tight calf muscles.  Flat feet.  Failure to warm-up properly before activity.  Obesity. PREVENTION  Warm up and stretch properly before activity.  Allow for adequate recovery between workouts.  Maintain physical fitness:  Strength, flexibility, and endurance.  Cardiovascular fitness.  Maintain a health body weight.  Avoid stress on the plantar fascia.  Wear properly fitted shoes, including arch supports for individuals who have flat feet.  PROGNOSIS  If treated properly, then the symptoms of plantar fasciitis usually resolve without surgery. However, occasionally surgery is necessary.  RELATED COMPLICATIONS   Recurrent symptoms that may result in a chronic condition.  Problems of the lower  back that are caused by compensating for the injury, such as limping.  Pain or weakness of the foot during push-off following surgery.  Chronic inflammation, scarring, and partial or complete fascia tear, occurring more often from repeated injections.  TREATMENT  Treatment initially involves the use of ice and medication to help reduce pain and inflammation. The use of strengthening and stretching exercises may help reduce pain with activity, especially stretches of the Achilles tendon. These exercises may be performed at home or with a therapist. Your caregiver may recommend that you use heel cups of arch supports to help reduce stress on the plantar fascia. Occasionally, corticosteroid injections are given to reduce inflammation. If symptoms persist for greater than 6 months despite non-surgical (conservative), then surgery may be recommended.   MEDICATION   If pain medication is necessary, then nonsteroidal anti-inflammatory medications, such as aspirin and ibuprofen, or other minor pain relievers, such as acetaminophen, are often recommended.  Do not take pain medication within 7 days before surgery.  Prescription pain relievers may be given if deemed necessary by your caregiver. Use only as directed and only as much as you need.  Corticosteroid injections may be given by your caregiver. These injections should be reserved for the most serious cases, because they may only be given a certain number of times.  HEAT AND COLD  Cold treatment (icing) relieves pain and reduces inflammation. Cold treatment should be applied for 10 to 15 minutes every 2 to 3 hours for inflammation and pain and immediately after any activity that aggravates your symptoms. Use ice packs or massage the area with a piece of ice (ice  massage).  Heat treatment may be used prior to performing the stretching and strengthening activities prescribed by your caregiver, physical therapist, or athletic trainer. Use a heat pack  or soak the injury in warm water.  SEEK IMMEDIATE MEDICAL CARE IF:  Treatment seems to offer no benefit, or the condition worsens.  Any medications produce adverse side effects.  EXERCISES- RANGE OF MOTION (ROM) AND STRETCHING EXERCISES - Plantar Fasciitis (Heel Spur Syndrome) These exercises may help you when beginning to rehabilitate your injury. Your symptoms may resolve with or without further involvement from your physician, physical therapist or athletic trainer. While completing these exercises, remember:   Restoring tissue flexibility helps normal motion to return to the joints. This allows healthier, less painful movement and activity.  An effective stretch should be held for at least 30 seconds.  A stretch should never be painful. You should only feel a gentle lengthening or release in the stretched tissue.  RANGE OF MOTION - Toe Extension, Flexion  Sit with your right / left leg crossed over your opposite knee.  Grasp your toes and gently pull them back toward the top of your foot. You should feel a stretch on the bottom of your toes and/or foot.  Hold this stretch for 10 seconds.  Now, gently pull your toes toward the bottom of your foot. You should feel a stretch on the top of your toes and or foot.  Hold this stretch for 10 seconds. Repeat  times. Complete this stretch 3 times per day.   RANGE OF MOTION - Ankle Dorsiflexion, Active Assisted  Remove shoes and sit on a chair that is preferably not on a carpeted surface.  Place right / left foot under knee. Extend your opposite leg for support.  Keeping your heel down, slide your right / left foot back toward the chair until you feel a stretch at your ankle or calf. If you do not feel a stretch, slide your bottom forward to the edge of the chair, while still keeping your heel down.  Hold this stretch for 10 seconds. Repeat 3 times. Complete this stretch 2 times per day.   STRETCH  Gastroc, Standing  Place hands  on wall.  Extend right / left leg, keeping the front knee somewhat bent.  Slightly point your toes inward on your back foot.  Keeping your right / left heel on the floor and your knee straight, shift your weight toward the wall, not allowing your back to arch.  You should feel a gentle stretch in the right / left calf. Hold this position for 10 seconds. Repeat 3 times. Complete this stretch 2 times per day.  STRETCH  Soleus, Standing  Place hands on wall.  Extend right / left leg, keeping the other knee somewhat bent.  Slightly point your toes inward on your back foot.  Keep your right / left heel on the floor, bend your back knee, and slightly shift your weight over the back leg so that you feel a gentle stretch deep in your back calf.  Hold this position for 10 seconds. Repeat 3 times. Complete this stretch 2 times per day.  STRETCH  Gastrocsoleus, Standing  Note: This exercise can place a lot of stress on your foot and ankle. Please complete this exercise only if specifically instructed by your caregiver.   Place the ball of your right / left foot on a step, keeping your other foot firmly on the same step.  Hold on to the wall or a  rail for balance.  Slowly lift your other foot, allowing your body weight to press your heel down over the edge of the step.  You should feel a stretch in your right / left calf.  Hold this position for 10 seconds.  Repeat this exercise with a slight bend in your right / left knee. Repeat 3 times. Complete this stretch 2 times per day.   STRENGTHENING EXERCISES - Plantar Fasciitis (Heel Spur Syndrome)  These exercises may help you when beginning to rehabilitate your injury. They may resolve your symptoms with or without further involvement from your physician, physical therapist or athletic trainer. While completing these exercises, remember:   Muscles can gain both the endurance and the strength needed for everyday activities through  controlled exercises.  Complete these exercises as instructed by your physician, physical therapist or athletic trainer. Progress the resistance and repetitions only as guided.  STRENGTH - Towel Curls  Sit in a chair positioned on a non-carpeted surface.  Place your foot on a towel, keeping your heel on the floor.  Pull the towel toward your heel by only curling your toes. Keep your heel on the floor. Repeat 3 times. Complete this exercise 2 times per day.  STRENGTH - Ankle Inversion  Secure one end of a rubber exercise band/tubing to a fixed object (table, pole). Loop the other end around your foot just before your toes.  Place your fists between your knees. This will focus your strengthening at your ankle.  Slowly, pull your big toe up and in, making sure the band/tubing is positioned to resist the entire motion.  Hold this position for 10 seconds.  Have your muscles resist the band/tubing as it slowly pulls your foot back to the starting position. Repeat 3 times. Complete this exercises 2 times per day.  Document Released: 06/14/2005 Document Revised: 09/06/2011 Document Reviewed: 09/26/2008 [facility] Patient Information 2014 [place], [place].   For instructions on how to put on your Plantar Fascial Brace, please visit [email or web address]

## 2019-12-25 ENCOUNTER — Telehealth: Payer: Self-pay | Admitting: *Deleted

## 2019-12-25 DIAGNOSIS — M79671 Pain in right foot: Secondary | ICD-10-CM

## 2019-12-25 DIAGNOSIS — M722 Plantar fascial fibromatosis: Secondary | ICD-10-CM

## 2019-12-25 DIAGNOSIS — G5761 Lesion of plantar nerve, right lower limb: Secondary | ICD-10-CM

## 2019-12-25 DIAGNOSIS — M5416 Radiculopathy, lumbar region: Secondary | ICD-10-CM

## 2019-12-25 NOTE — Telephone Encounter (Signed)
Faxed required form, clinicals and demographics to [facility]. 

## 2019-12-25 NOTE — Telephone Encounter (Signed)
-----   Message from Edwin Cap, DPM sent at 12/24/2019  9:58 PM EDT ----- Hi Val,  Could you order EMG/NCV for Mr Sanger? R leg, dx is lumbar radiculopathy, lateral plantar nerve entrapment.  Thanks! Madelaine Bhat

## 2020-01-07 ENCOUNTER — Other Ambulatory Visit: Payer: Self-pay | Admitting: Podiatry

## 2020-01-07 DIAGNOSIS — M722 Plantar fascial fibromatosis: Secondary | ICD-10-CM

## 2020-01-09 ENCOUNTER — Other Ambulatory Visit: Payer: Self-pay | Admitting: Internal Medicine

## 2020-01-15 ENCOUNTER — Telehealth: Payer: Self-pay | Admitting: Podiatry

## 2020-01-15 NOTE — Telephone Encounter (Signed)
Pt wife called to see if the Dr can get pt in to see the neurological dr sooner the appt is set in september

## 2020-01-15 NOTE — Telephone Encounter (Signed)
I spoke with pt and informed that [facility] would have cancellation schedule and he could periodically call to see if he could get scheduled sooner.

## 2020-01-18 ENCOUNTER — Other Ambulatory Visit: Payer: Self-pay

## 2020-01-18 ENCOUNTER — Ambulatory Visit: Payer: BC Managed Care – PPO | Admitting: Podiatry

## 2020-01-18 DIAGNOSIS — G5761 Lesion of plantar nerve, right lower limb: Secondary | ICD-10-CM | POA: Diagnosis not present

## 2020-01-18 DIAGNOSIS — M79671 Pain in right foot: Secondary | ICD-10-CM | POA: Diagnosis not present

## 2020-01-18 DIAGNOSIS — M722 Plantar fascial fibromatosis: Secondary | ICD-10-CM

## 2020-01-18 DIAGNOSIS — M5416 Radiculopathy, lumbar region: Secondary | ICD-10-CM

## 2020-01-18 MED ORDER — METHYLPREDNISOLONE 4 MG PO TBPK: ORAL_TABLET | ORAL | 0 refills | Status: DC

## 2020-01-19 ENCOUNTER — Encounter: Payer: Self-pay | Admitting: Podiatry

## 2020-01-19 NOTE — Progress Notes (Signed)
  Subjective:  Patient ID: Logan Davis, male    DOB: [birth year],  MRN: [id number]  Chief Complaint  Patient presents with  . Follow-up    R plantar forefoot/midfoot submet 1. Pt stated, "The injection helped for 3-4 days, then the burning pain got worse. I was wearing the brace. 9-10/10. A couple of days ago, I started wearing a new brace/sleeve over my heel. I wear this brace, a sock, and then my plantar fascial brace. This has been helping. My appt with [facility] is scheduled for 03/05/2020 (next available)".    54 y.o. male presents with the above complaint.  History confirmed with patient.  He said the injection he had at his last visit only helped for a couple of days and has not improved since then.  He obtained a new compression sleeve that has a pad around the heel that he is not wearing with the plantar fascial brace and he says that this is helping.  Is not able to get into his nerve conduction studies until September 8.  He saw his spine surgeon Dr. Petra Kuba at [facility] after the last visit with me, she stated that she also would get urgency with the test results show if this is related to a nerve condition in the foot.  He states he also has more pain towards the front of the foot on the medial side of the arch.   Review of Systems: Negative except as noted in the HPI. Denies N/V/F/Ch.   Objective:    Constitutional Well developed. Well nourished.  Vascular Dorsalis pedis pulses palpable bilaterally. Posterior tibial pulses palpable bilaterally. Capillary refill normal to all digits.  No cyanosis or clubbing noted. Pedal hair growth normal.  Neurologic Normal speech. Oriented to person, place, and time. Epicritic sensation to light touch grossly present bilaterally.  Dermatologic Nails well groomed and normal in appearance. No open wounds. No skin lesions.  Orthopedic: Normal joint ROM without pain or crepitus bilaterally. No visible  deformities. Still pain with palpation of the medial heel, now also pain along the abductor hallucis insertion and medial distal arch.  This radiates into the hallux. Straight leg raise test was negative on the right side. Negative Tinel's right tarsal tunnel, the porta pedis, lateral plantar nerve.      Assessment:   1. Right foot pain   2. Plantar fasciitis   3. 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   -At the last visit he had an injection which was not very helpful.  I like him start doing some stretching exercise and physical therapy.  [facility] [address], he has been there before for his upper extremity and spine issues.  Referral be sent. -Today a CAM boot was dispensed for increased immobilization while weightbearing. -Medrol taper was sent to his pharmacy for anti-inflammatory effect.  Return in about 8 weeks (around 03/12/2020).    Sharl Ma, DPM 01/19/2020

## 2020-01-21 ENCOUNTER — Telehealth: Payer: Self-pay | Admitting: *Deleted

## 2020-01-21 DIAGNOSIS — M722 Plantar fascial fibromatosis: Secondary | ICD-10-CM

## 2020-01-21 DIAGNOSIS — M79671 Pain in right foot: Secondary | ICD-10-CM

## 2020-01-21 DIAGNOSIS — M5416 Radiculopathy, lumbar region: Secondary | ICD-10-CM

## 2020-01-21 DIAGNOSIS — G5761 Lesion of plantar nerve, right lower limb: Secondary | ICD-10-CM

## 2020-01-21 NOTE — Telephone Encounter (Signed)
Faxed required form, [employer] with clinicals and demographics to [facility].

## 2020-01-21 NOTE — Telephone Encounter (Signed)
-----   Message from Edwin Cap, DPM sent at 01/19/2020  1:30 PM EDT ----- Hi Val,  I'd like to send Mr Linford to PT, he has a prior therapist at [facility] on [address], would you be able to place a referral? Do you need an order in Epic from me for that? Or any particular form filled out?  Thanks! Madelaine Bhat

## 2020-03-04 ENCOUNTER — Encounter: Payer: Self-pay | Admitting: *Deleted

## 2020-03-05 ENCOUNTER — Ambulatory Visit: Payer: BC Managed Care – PPO | Admitting: Diagnostic Neuroimaging

## 2020-03-13 ENCOUNTER — Ambulatory Visit (INDEPENDENT_AMBULATORY_CARE_PROVIDER_SITE_OTHER): Payer: BC Managed Care – PPO | Admitting: Podiatry

## 2020-03-13 ENCOUNTER — Other Ambulatory Visit: Payer: Self-pay

## 2020-03-13 DIAGNOSIS — M5416 Radiculopathy, lumbar region: Secondary | ICD-10-CM | POA: Diagnosis not present

## 2020-03-13 DIAGNOSIS — M722 Plantar fascial fibromatosis: Secondary | ICD-10-CM

## 2020-03-13 DIAGNOSIS — G5761 Lesion of plantar nerve, right lower limb: Secondary | ICD-10-CM

## 2020-03-13 DIAGNOSIS — M79671 Pain in right foot: Secondary | ICD-10-CM | POA: Diagnosis not present

## 2020-03-13 MED ORDER — GABAPENTIN 800 MG PO TABS: 800.0000 mg | ORAL_TABLET | Freq: Every day | ORAL | 0 refills | Status: DC

## 2020-03-15 ENCOUNTER — Other Ambulatory Visit: Payer: Self-pay | Admitting: Podiatry

## 2020-03-15 NOTE — Progress Notes (Signed)
  Subjective:  Patient ID: Logan Davis, male    DOB: 04-Feb-1966,  MRN: [id number]  Chief Complaint  Patient presents with  . Foot Pain    6 week follow up right heel pain    54 y.o. male returns with the above complaint.  History confirmed with patient.  He states nearly all of his pain has improved spontaneously and does not have a good answer of why but he feels much better.  Does have some residual heel pain in the area that we had previously done an injection for.  He is getting ready to go out of town and requests a second injection today.   Review of Systems: Negative except as noted in the HPI. Denies N/V/F/Ch.   Objective:    Constitutional Well developed. Well nourished.  Vascular Dorsalis pedis pulses palpable bilaterally. Posterior tibial pulses palpable bilaterally. Capillary refill normal to all digits.  No cyanosis or clubbing noted. Pedal hair growth normal.  Neurologic Normal speech. Oriented to person, place, and time. Epicritic sensation to light touch grossly present bilaterally.  Dermatologic Nails well groomed and normal in appearance. No open wounds. No skin lesions.  Orthopedic: Normal joint ROM without pain or crepitus bilaterally. No visible deformities. Mild pain with palpation of the medial heel, no longer has pain along the abductor hallucis insertion and medial distal arch.       Assessment:   1. Right foot pain   2. Plantar fasciitis   3. 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   -Continue physical therapy -I do think would be beneficial still to have the nerve conduction studies completed these are scheduled for November. -Gabapentin has been helpful for him.  He cannot take it during the day due to adverse effects.  He requests increasing his nightly prescription to 900 mg.  I sent a prescription of this to his pharmacy  After  sterile prep with povidone-iodine solution and alcohol, the right heel was injected with 0.5cc 2% xylocaine plain, 0.5cc 0.5% marcaine plain, 5mg  triamcinolone acetonide, and 2mg  dexamethasone was injected along the plantar fascia at the insertion on the plantar calcaneus. The patient tolerated the procedure well without complication.   Return in about 2 months (around 05/13/2020).    , DPM 03/15/2020

## 2020-03-16 NOTE — Telephone Encounter (Signed)
Please advise 

## 2020-03-21 ENCOUNTER — Telehealth: Payer: Self-pay

## 2020-03-21 NOTE — Telephone Encounter (Signed)
Left message for patient to call back to discuss letter needs.

## 2020-03-21 NOTE — Telephone Encounter (Signed)
New message    Need a letter for his job stating he does NOT take insulin.   [employer] [place]   Phone # [phone]   Attention: Onalee Hua

## 2020-03-24 NOTE — Telephone Encounter (Signed)
Verified with patient that he is taking trulicity and that is an insulin.  Patient says he is no longer taking lantus.  Patient request we call Loraine Leriche [phone] and advise him of medication dosage.  Spoke with Loraine Leriche and patient needs to provide employer with information regarding medication and dosage.  Mark with have Theodoro Grist with compliance contact patient to gather information.

## 2020-03-24 NOTE — Telephone Encounter (Signed)
Left message for patient to callback regarding his letter need and clarify insulin.

## 2020-03-24 NOTE — Telephone Encounter (Signed)
Logan Davis DOB 03-28-2066 - returning your call regarding [employer] letter/Dr Ellison/not taking insulin. Best call back number is [phone]

## 2020-03-25 ENCOUNTER — Telehealth: Payer: Self-pay

## 2020-03-25 NOTE — Telephone Encounter (Signed)
Hey do you have any paperwork on patient for work

## 2020-03-25 NOTE — Telephone Encounter (Signed)
Patient aware that we have not received the paperwork as of right now and once we do we will complete and contact patient

## 2020-03-25 NOTE — Telephone Encounter (Signed)
New paperwork was to be faxed from employer to be filled out.  Had not received as of end of day 03/24/2020.

## 2020-03-25 NOTE — Telephone Encounter (Signed)
New message    Checking on the status of paperwork that pertaining to his job asking for a call back.

## 2020-03-31 ENCOUNTER — Other Ambulatory Visit: Payer: Self-pay

## 2020-03-31 ENCOUNTER — Ambulatory Visit (INDEPENDENT_AMBULATORY_CARE_PROVIDER_SITE_OTHER): Payer: BC Managed Care – PPO | Admitting: Internal Medicine

## 2020-03-31 VITALS — BP 132/76 | HR 91 | Ht 67.0 in | Wt 204.0 lb

## 2020-03-31 DIAGNOSIS — E119 Type 2 diabetes mellitus without complications: Secondary | ICD-10-CM | POA: Diagnosis not present

## 2020-03-31 LAB — POCT GLYCOSYLATED HEMOGLOBIN (HGB A1C): Hemoglobin A1C: 6.6 % — AB (ref 4.0–5.6)

## 2020-03-31 MED ORDER — RYBELSUS 7 MG PO TABS: 7.0000 mg | ORAL_TABLET | Freq: Every day | ORAL | 3 refills | Status: DC

## 2020-03-31 NOTE — Patient Instructions (Signed)
-   Continue  Metformin 500 mg twice daily  - Continue  Farxiga 10 mg daily  - Stop Trulicity  - Rybelsus 7 mg , 1 tablet daily        - HOW TO TREAT LOW BLOOD SUGARS (Blood sugar LESS THAN 70 MG/DL)  Please follow the RULE OF 15 for the treatment of hypoglycemia treatment (when your (blood sugars are less than 70 mg/dL)    STEP 1: Take 15 grams of carbohydrates when your blood sugar is low, which includes:   3-4 GLUCOSE TABS  OR  3-4 OZ OF JUICE OR REGULAR SODA OR  ONE TUBE OF GLUCOSE GEL     STEP 2: RECHECK blood sugar in 15 MINUTES STEP 3: If your blood sugar is still low at the 15 minute recheck --> then, go back to STEP 1 and treat AGAIN with another 15 grams of carbohydrates.

## 2020-03-31 NOTE — Progress Notes (Signed)
Name: Logan Davis  Age/ Sex: 54 y.o., male   MRN/ DOB: [id number], Jan 18, 1966     PCP: Rosemary Holms   Reason for Endocrinology Evaluation: Type 2 Diabetes Mellitus     Initial Endocrinology Clinic Visit: 09/11/2018    PATIENT IDENTIFIER: Logan Davis is a 54 y.o. male with a past medical history of HTN, dyslipidemia and T2DM. The patient has followed with Endocrinology clinic since 09/11/2018 for consultative assistance with management of his diabetes.  DIABETIC HISTORY:  Logan Davis was diagnosed with T2DM in  2018. He has reported intolerance to Metformin. He has tried [place] in the past without reported intolerance. Insulin has been started shortly after diagnosis.  His hemoglobin A1c has ranged from 8.7% in 2019, peaking at > 15.5% in 2018.   On his initial visit to our clinic his A1c was 13.0 %. He was on Farxiga (but had stopped it a month prior to his presentation),so we stopped it,  he was skipping lantus ~ 3-4 x a week . He was also on Victoza. We attempted to switch Victoza to trulicity but he opted to continue with Victoza.  In 11/2018 we started [place] but he stopped taking it on his own 06/2019    farxiga and trulicity started 11/2018 SUBJECTIVE:   During the last visit (10/22/2019): A1c 6.8 %.  Stopped Lantus, continued metformin, farxiga, and Trulicity    Today (03/31/2020): Logan Davis is here for a 3 month follow up on his diabetes management.  He checks his blood sugars 0 times daily for the past. The patient has not had hypoglycemic episodes since the last clinic visit.  He stopped using Lantus a couple weeks ago, patient does not know why, but he denies hypoglycemia.   ROS: As per HPI and as detailed below: Review of Systems  HENT: Negative for congestion and sore throat.   Gastrointestinal: Negative for diarrhea and nausea.      HOME DIABETES REGIMEN:  Metformin 500 mg XR 1 tab BID Farxiga 10 mg daily  Trulicity 1.5 mg weekly       METER DOWNLOAD SUMMARY: 9/21-10/09/2019 Fingerstick Blood Glucose Tests = 8 Overall Mean FS Glucose = 116 Standard Deviation = 23  BG Ranges: Low = 87 High =156    Hypoglycemic Events/30 Days: BG < 50 = 0 Episodes of symptomatic severe hypoglycemia = 0     HISTORY:  Past Medical History:  Past Medical History:  Diagnosis Date  . Cervical radiculopathy   . Diabetes mellitus without complication (HCC)   . Foot pain, right   . Hyperlipidemia   . Hypertension   . Injury of cervical spine (HCC)    h/o  . Lumbar radiculopathy     Past Surgical History: No past surgical history on file.  Social History:  reports that he has never smoked. He has never used smokeless tobacco. He reports that he does not drink alcohol and does not use drugs. Family History:  Family History  Problem Relation Age of Onset  . Diabetes Mother   . Hypertension Mother   . Hypertension Father   . Diabetes Father   . Diabetes Sister   . Hypertension Sister   . Hyperlipidemia Sister      HOME MEDICATIONS: Allergies as of 03/31/2020      Reactions   Sulfa Antibiotics Anaphylaxis      Medication List       Accurate as of March 31, 2020 12:46 PM. If you  have any questions, ask your nurse or doctor.        amLODipine 10 MG tablet Commonly known as: NORVASC Take 10 mg by mouth daily.   atorvastatin 20 MG tablet Commonly known as: LIPITOR Take 1 tablet (20 mg total) by mouth daily.   escitalopram 10 MG tablet Commonly known as: LEXAPRO Take 10 mg by mouth daily.   Farxiga 10 MG Tabs tablet Generic drug: dapagliflozin propanediol TAKE 1 TABLET BY MOUTH DAILY   gabapentin 800 MG tablet Commonly known as: NEURONTIN TAKE 1 TABLET(800 MG) BY MOUTH AT BEDTIME   hydrochlorothiazide 12.5 MG tablet Commonly known as: HYDRODIURIL Take 12.5 mg by mouth daily.   LIFESCAN FINEPOINT LANCETS Misc Use to check blood sugar 3 time(s) daily   losartan 50 MG tablet Commonly known  as: COZAAR 100 mg.   losartan 100 MG tablet Commonly known as: COZAAR Take 100 mg by mouth daily.   meloxicam 15 MG tablet Commonly known as: Mobic Take 1 tablet (15 mg total) by mouth daily.   metFORMIN 500 MG 24 hr tablet Commonly known as: GLUCOPHAGE-XR TAKE 2 TABLETS(1000 MG) BY MOUTH TWICE DAILY   methocarbamol 500 MG tablet Commonly known as: ROBAXIN Take by mouth.   methylPREDNISolone 4 MG Tbpk tablet Commonly known as: MEDROL DOSEPAK 6 day dose pack - take as directed   naproxen 500 MG tablet Commonly known as: NAPROSYN Take 500 mg by mouth 2 (two) times daily.   OneTouch Verio test strip Generic drug: glucose blood Twice daily   Pen Needles 32G X 4 MM Misc 1 Device by Does not apply route as directed.   simvastatin 20 MG tablet Commonly known as: ZOCOR Take 20 mg by mouth at bedtime.   Trulicity 1.5 MG/0.5ML Sopn Generic drug: Dulaglutide Inject 1.5 mg into the skin once a week.         DATA REVIEWED: 06/26/2018  BUN/Cr 16/1.44 mg/dL GFR 65 V4B 44.9 %     Results for Logan Davis, Logan Davis (MRN [id number]) as of 07/11/2019 08:15  Ref. Range 07/09/2019 16:43  Creatinine,U Latest Units: mg/dL 665.9  Microalb, Ur Latest Ref Range: 0.0 - 1.9 mg/dL 4.4 (H)  MICROALB/CREAT RATIO Latest Ref Range: 0.0 - 30.0 mg/g 0.9    In Office BG 110 mg/dL  ASSESSMENT / PLAN / RECOMMENDATIONS:   1) Type 2 Diabetes Mellitus, Optimally Controlled, Without  complications - Most recent A1c of 6.6 %. 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  We discussed switching this to oral Rybelsus, which the patient in agreement  above    MEDICATIONS:  Continue Metformin 500 mg XR twice daily  Continue Farxiga 10 mg daily   Stop Trulicity   Start Rybelsus 7 mg daily   EDUCATION / INSTRUCTIONS:  BG monitoring instructions: Patient is instructed to check his blood sugars 2 times a day, fasting and bedtime  Call [facility] if: BG persistently < 70  . I reviewed the Rule of 15 for the treatment of hypoglycemia in detail with the patient. Literature supplied.    2) Diabetic complications:   Eye: Does not have known diabetic retinopathy.   Neuro/  Feet: Does not have known diabetic peripheral neuropathy.  Renal: Patient does not have known baseline CKD. He is  on an ACEI/ARB at present. Microalbumin      F/U in 4 months    Signed electronically by: Lyndle Herrlich, MD  [facility]  [facility] [address] [place], [place] [address] Phone: [phone] FAX: [phone]   CC: Bryon Lions, PA-C [address] [place] [address] Phone: [phone]  Fax: [phone]  Return to Endocrinology clinic as below: Future Appointments  Date Time Provider Department Center  03/31/2020  3:40 PM Conda Wannamaker, Konrad Dolores, MD [facility] None  05/09/2020 10:30 AM Penumalli, Glenford Bayley, MD [facility] None  05/15/2020  4:15 PM McDonald, Rachelle Hora, DPM [facility]

## 2020-04-04 ENCOUNTER — Telehealth: Payer: Self-pay

## 2020-04-04 NOTE — Telephone Encounter (Signed)
Pt asking if provider has discounts for RYBELSUS 7 MG. Pt states copay is $70.00.  Please call pt [phone].

## 2020-04-07 NOTE — Telephone Encounter (Signed)
Spoke with patient and he has already picked up medication. Patient will stop by and get a discount card before next fill

## 2020-04-10 ENCOUNTER — Other Ambulatory Visit: Payer: Self-pay | Admitting: Podiatry

## 2020-05-07 ENCOUNTER — Encounter: Payer: Self-pay | Admitting: *Deleted

## 2020-05-09 ENCOUNTER — Other Ambulatory Visit: Payer: Self-pay

## 2020-05-09 ENCOUNTER — Ambulatory Visit (INDEPENDENT_AMBULATORY_CARE_PROVIDER_SITE_OTHER): Payer: BC Managed Care – PPO | Admitting: Diagnostic Neuroimaging

## 2020-05-09 ENCOUNTER — Encounter: Payer: Self-pay | Admitting: Diagnostic Neuroimaging

## 2020-05-09 VITALS — BP 163/96 | HR 95 | Ht 69.5 in | Wt 197.0 lb

## 2020-05-09 DIAGNOSIS — R200 Anesthesia of skin: Secondary | ICD-10-CM

## 2020-05-09 DIAGNOSIS — R202 Paresthesia of skin: Secondary | ICD-10-CM

## 2020-05-09 NOTE — Patient Instructions (Signed)
  NECK PAIN (right arm numbness, right leg numbness) - continues with intermittent symptoms; sequelae of prior cervical myelomalacia, radiculopathy; s/p ACDF in Sept 2020 - follow up with neurosurgery (Dr. Petra Kuba)  RIGHT HEEL PAIN / PLANTAR FASCIITIS - improved after injection - follow up with podiatry

## 2020-05-09 NOTE — Progress Notes (Signed)
[facility]  PATIENT: Logan Davis DOB: 1966/01/22  REFERRING CLINICIAN: Edwin Cap, DPM HISTORY FROM: patient  REASON FOR VISIT: new consult    HISTORICAL  CHIEF COMPLAINT:  Chief Complaint  Patient presents with  . New Patient (Initial Visit)    right sided body numbness/falls  . Room 6    alone in room    HISTORY OF PRESENT ILLNESS:   54 year old male here for evaluation of right foot numbness.  Patient has history of cervical myelopathy and cord compression in 2020.  Patient had onset of neck pain radiating to his right arm, right torso and right leg.  MRI and EMG were obtained and patient underwent ACDF at C3-4 and C4-5.  He was also found to have right carpal tunnel syndrome.  He is continue to have issues with pain and has been managed by neurosurgery (Dr. Petra Kuba) and neurology (Dr. Hyacinth Meeker).  Patient was also referred to Dr. Lilian Kapur for right heel pain.  He was diagnosed with plantar fasciitis and treated with steroid injection.  Patient reports significant improvement with steroid injection.  Patient was then referred to [place] for EMG nerve conduction study to evaluate for other neuropathy issues affecting his right foot.  Patient also has diabetes, hypercholesterolemia, arthritis, hypertension.   REVIEW OF SYSTEMS: Full 14 system review of systems performed and negative with exception of: As per HPI.  ALLERGIES: Allergies  Allergen Reactions  . Sulfa Antibiotics Anaphylaxis    HOME MEDICATIONS: Outpatient Medications Prior to Visit  Medication Sig Dispense Refill  . amLODipine (NORVASC) 10 MG tablet Take 10 mg by mouth daily.    Marland Kitchen atorvastatin (LIPITOR) 20 MG tablet Take 1 tablet (20 mg total) by mouth daily. 90 tablet 3  . escitalopram (LEXAPRO) 10 MG tablet Take 10 mg by mouth daily.    Marland Kitchen FARXIGA 10 MG TABS tablet TAKE 1 TABLET BY MOUTH DAILY 30 tablet 11  . gabapentin (NEURONTIN) 800 MG tablet TAKE 1 TABLET(800 MG) BY MOUTH  AT BEDTIME 90 tablet 0  . hydrochlorothiazide (HYDRODIURIL) 12.5 MG tablet Take 12.5 mg by mouth daily.    . Insulin Pen Needle (PEN NEEDLES) 32G X 4 MM MISC 1 Device by Does not apply route as directed. 100 each 11  . LIFESCAN FINEPOINT LANCETS MISC Use to check blood sugar 3 time(s) daily    . losartan (COZAAR) 50 MG tablet 100 mg.     . meloxicam (MOBIC) 15 MG tablet TAKE 1 TABLET(15 MG) BY MOUTH DAILY 30 tablet 3  . metFORMIN (GLUCOPHAGE-XR) 500 MG 24 hr tablet TAKE 2 TABLETS(1000 MG) BY MOUTH TWICE DAILY 180 tablet 6  . methocarbamol (ROBAXIN) 500 MG tablet Take by mouth.     . methylPREDNISolone (MEDROL DOSEPAK) 4 MG TBPK tablet 6 day dose pack - take as directed 21 tablet 0  . naproxen (NAPROSYN) 500 MG tablet Take 500 mg by mouth 2 (two) times daily.     Letta Pate VERIO test strip Twice daily 100 each 6  . Semaglutide (RYBELSUS) 7 MG TABS Take 7 mg by mouth daily. 90 tablet 3  . simvastatin (ZOCOR) 20 MG tablet Take 20 mg by mouth at bedtime.    Marland Kitchen losartan (COZAAR) 100 MG tablet Take 100 mg by mouth daily.     No facility-administered medications prior to visit.    PAST MEDICAL HISTORY: Past Medical History:  Diagnosis Date  . Cervical radiculopathy   . Diabetes mellitus without complication (HCC)   . Foot pain,  right   . Hyperlipidemia   . Hypertension   . Injury of cervical spine (HCC)    h/o  . Lumbar radiculopathy   . Plantar fasciitis     PAST SURGICAL HISTORY: History reviewed. No pertinent surgical history.  FAMILY HISTORY: Family History  Problem Relation Age of Onset  . Diabetes Mother   . Hypertension Mother   . Hypertension Father   . Diabetes Father   . Diabetes Sister   . Hypertension Sister   . Hyperlipidemia Sister     SOCIAL HISTORY: Social History   Socioeconomic History  . Marital status: Divorced    Spouse name: Not on file  . Number of children: Not on file  . Years of education: Not on file  . Highest education level: Not on file   Occupational History  . Occupation: mover    Comment: full time  Tobacco Use  . Smoking status: Never Smoker  . Smokeless tobacco: Never Used  Substance and Sexual Activity  . Alcohol use: No  . Drug use: No  . Sexual activity: Not on file  Other Topics Concern  . Not on file  Social History Narrative   Lives alone      Right Handed   Drinks 1-2 cups caffeine daily   Social Determinants of Health   Financial Resource Strain:   . Difficulty of Paying Living Expenses: Not on file  Food Insecurity:   . Worried About Programme researcher, broadcasting/film/video in the Last Year: Not on file  . Ran Out of Food in the Last Year: Not on file  Transportation Needs:   . Lack of Transportation (Medical): Not on file  . Lack of Transportation (Non-Medical): Not on file  Physical Activity:   . Days of Exercise per Week: Not on file  . Minutes of Exercise per Session: Not on file  Stress:   . Feeling of Stress : Not on file  Social Connections:   . Frequency of Communication with Friends and Family: Not on file  . Frequency of Social Gatherings with Friends and Family: Not on file  . Attends Religious Services: Not on file  . Active Member of Clubs or Organizations: Not on file  . Attends Banker Meetings: Not on file  . Marital Status: Not on file  Intimate Partner Violence:   . Fear of Current or Ex-Partner: Not on file  . Emotionally Abused: Not on file  . Physically Abused: Not on file  . Sexually Abused: Not on file     PHYSICAL EXAM  GENERAL EXAM/CONSTITUTIONAL: Vitals:  Vitals:   05/09/20 1036  BP: (!) 163/96  Pulse: 95  Weight: 197 lb (89.4 kg)  Height: 5' 9.5" (1.765 m)     Body mass index is 28.67 kg/m. Wt Readings from Last 3 Encounters:  05/09/20 197 lb (89.4 kg)  03/31/20 204 lb (92.5 kg)  10/22/19 212 lb 12.8 oz (96.5 kg)     Patient is in no distress; well developed, nourished and groomed; neck is supple  CARDIOVASCULAR:  Examination of carotid  arteries is normal; no carotid bruits  Regular rate and rhythm, no murmurs  Examination of peripheral vascular system by observation and palpation is normal  EYES:  Ophthalmoscopic exam of optic discs and posterior segments is normal; no papilledema or hemorrhages  No exam data present  MUSCULOSKELETAL:  Gait, strength, tone, movements noted in Neurologic exam below  NEUROLOGIC: MENTAL STATUS:  No flowsheet data found.  awake, alert, oriented to  person, place and time  recent and remote memory intact  normal attention and concentration  language fluent, comprehension intact, naming intact  fund of knowledge appropriate  CRANIAL NERVE:   2nd - no papilledema on fundoscopic exam  2nd, 3rd, 4th, 6th - pupils equal and reactive to light, visual fields full to confrontation, extraocular muscles intact, no nystagmus  5th - facial sensation symmetric  7th - facial strength symmetric  8th - hearing intact  9th - palate elevates symmetrically, uvula midline  11th - shoulder shrug symmetric  12th - tongue protrusion midline  MOTOR:   normal bulk and tone, full strength in the BUE, BLE  SENSORY:   normal and symmetric to light touch, temperature, vibration  COORDINATION:   finger-nose-finger, fine finger movements normal  REFLEXES:   deep tendon reflexes TRACE and symmetric  GAIT/STATION:   narrow based gait     DIAGNOSTIC DATA (LABS, IMAGING, TESTING) - I reviewed patient records, labs, notes, testing and imaging myself where available.  No results found for: WBC, HGB, HCT, MCV, PLT    Component Value Date/Time   NA 142 03/12/2019 1649   K 3.9 03/12/2019 1649   CL 104 03/12/2019 1649   CO2 30 03/12/2019 1649   GLUCOSE 134 (H) 03/12/2019 1649   BUN 15 03/12/2019 1649   CREATININE 1.32 03/12/2019 1649   CALCIUM 9.6 03/12/2019 1649   Lab Results  Component Value Date   CHOL 176 12/05/2018   HDL 52.50 12/05/2018   LDLCALC 106 (H) 12/05/2018    TRIG 87.0 12/05/2018   CHOLHDL 3 12/05/2018   Lab Results  Component Value Date   HGBA1C 6.6 (A) 03/31/2020   No results found for: VITAMINB12 No results found for: TSH   11/28/18 MRI cervical spine 1. Diffuse cervical spine spondylosis as described above. 2.  No acute osseous injury of the cervical spine.  Cord: Normal morphology. Tiny focus of T2 hyperintensity in the right paracentral aspect of the cervical spinal cord at the level of C3-4 which may reflect a small area of myelomalacia given the adjacent disc disease.  C3-4: Right paracentral disc protrusion contacting the ventral cervical spinal cord. Mild bilateral facet arthropathy. Moderate left and mild right foraminal stenosis. Mild spinal stenosis.    ASSESSMENT AND PLAN  54 y.o. year old male here with right heel pain, likely related to plantar fasciitis; also with right neck pain, right arm numbness and pain, right leg numbness and pain, related to cervical myelopathy /myelomalacia sequelae, status post surgery.  Dx:  1. Numbness and tingling of foot     PLAN:  NECK PAIN (right arm numbness, right leg numbness) - continues with intermittent symptoms; sequelae of prior cervical myelomalacia, radiculopathy; s/p ACDF in Sept 2020 - follow up with neurosurgery (Dr. Petra Kuba)  RIGHT HEEL PAIN / PLANTAR FASCIITIS - improved after injection - follow up with podiatry; symptoms significant improved after injection; neurologic examination unremarkable at this time; EMG not recommended at this time  Return for return to PCP.    Suanne Marker, MD 05/09/2020, 11:08 AM Certified in Neurology, Neurophysiology and Neuroimaging  [facility] [address] [place], [place] [address] [phone]

## 2020-05-15 ENCOUNTER — Ambulatory Visit: Payer: BC Managed Care – PPO | Admitting: Podiatry

## 2020-08-01 ENCOUNTER — Ambulatory Visit: Payer: BC Managed Care – PPO | Admitting: Internal Medicine

## 2020-08-01 NOTE — Progress Notes (Deleted)
Name: Logan Davis  Age/ Sex: 55 y.o., male   MRN/ DOB: [id number], 12-21-1965     PCP: Rosemary Holms   Reason for Endocrinology Evaluation: Type 2 Diabetes Mellitus     Initial Endocrinology Clinic Visit: 09/11/2018    PATIENT IDENTIFIER: Logan Davis is a 55 y.o. male with a past medical history of HTN, dyslipidemia and T2DM. The patient has followed with Endocrinology clinic since 09/11/2018 for consultative assistance with management of his diabetes.  DIABETIC HISTORY:  Logan Davis was diagnosed with T2DM in  2018. He has reported intolerance to Metformin. He has tried [place] in the past without reported intolerance. Insulin has been started shortly after diagnosis.  His hemoglobin A1c has ranged from 8.7% in 2019, peaking at > 15.5% in 2018.   On his initial visit to our clinic his A1c was 13.0 %. He was on Farxiga (but had stopped it a month prior to his presentation),so we stopped it,  he was skipping lantus ~ 3-4 x a week . He was also on Victoza. We attempted to switch Victoza to trulicity but he opted to continue with Victoza.  In 11/2018 we started Marcelline Deist but he stopped taking it on his own 06/2019    farxiga and trulicity started 11/2018  Switched Trulicity to Rybelsus 03/2020 SUBJECTIVE:   During the last visit (03/31/2020): A1c 6.6 %.   continued metformin, farxiga, and switched Trulicity to Rybelsus     Today (08/01/2020): Logan Davis is here for a follow up on his diabetes management.  He checks his blood sugars 0 times daily for the past. The patient has not had hypoglycemic episodes since the last clinic visit.  He stopped using Lantus a couple weeks ago, patient does not know why, but he denies hypoglycemia.   HOME DIABETES REGIMEN:  Metformin 500 mg XR 1 tab BID Farxiga 10 mg daily  Rybelsus 7 mg daily      METER DOWNLOAD SUMMARY: 9/21-10/09/2019 Fingerstick Blood Glucose Tests = 8 Overall Mean FS Glucose = 116 Standard Deviation =  23  BG Ranges: Low = 87 High =156    Hypoglycemic Events/30 Days: BG < 50 = 0 Episodes of symptomatic severe hypoglycemia = 0     HISTORY:  Past Medical History:  Past Medical History:  Diagnosis Date  . Cervical radiculopathy   . Diabetes mellitus without complication (HCC)   . Foot pain, right   . Hyperlipidemia   . Hypertension   . Injury of cervical spine (HCC)    h/o  . Lumbar radiculopathy   . Plantar fasciitis     Past Surgical History: No past surgical history on file.  Social History:  reports that he has never smoked. He has never used smokeless tobacco. He reports that he does not drink alcohol and does not use drugs. Family History:  Family History  Problem Relation Age of Onset  . Diabetes Mother   . Hypertension Mother   . Hypertension Father   . Diabetes Father   . Diabetes Sister   . Hypertension Sister   . Hyperlipidemia Sister      HOME MEDICATIONS: Allergies as of 08/01/2020      Reactions   Sulfa Antibiotics Anaphylaxis      Medication List       Accurate as of August 01, 2020 12:17 PM. If you have any questions, ask your nurse or doctor.        amLODipine 10 MG tablet Commonly  known as: NORVASC Take 10 mg by mouth daily.   atorvastatin 20 MG tablet Commonly known as: LIPITOR Take 1 tablet (20 mg total) by mouth daily.   escitalopram 10 MG tablet Commonly known as: LEXAPRO Take 10 mg by mouth daily.   Farxiga 10 MG Tabs tablet Generic drug: dapagliflozin propanediol TAKE 1 TABLET BY MOUTH DAILY   gabapentin 800 MG tablet Commonly known as: NEURONTIN TAKE 1 TABLET(800 MG) BY MOUTH AT BEDTIME   hydrochlorothiazide 12.5 MG tablet Commonly known as: HYDRODIURIL Take 12.5 mg by mouth daily.   LIFESCAN FINEPOINT LANCETS Misc Use to check blood sugar 3 time(s) daily   losartan 50 MG tablet Commonly known as: COZAAR 100 mg.   losartan 100 MG tablet Commonly known as: COZAAR Take 100 mg by mouth daily.   meloxicam  15 MG tablet Commonly known as: MOBIC TAKE 1 TABLET(15 MG) BY MOUTH DAILY   metFORMIN 500 MG 24 hr tablet Commonly known as: GLUCOPHAGE-XR TAKE 2 TABLETS(1000 MG) BY MOUTH TWICE DAILY   methocarbamol 500 MG tablet Commonly known as: ROBAXIN Take by mouth.   methylPREDNISolone 4 MG Tbpk tablet Commonly known as: MEDROL DOSEPAK 6 day dose pack - take as directed   naproxen 500 MG tablet Commonly known as: NAPROSYN Take 500 mg by mouth 2 (two) times daily.   OneTouch Verio test strip Generic drug: glucose blood Twice daily   Pen Needles 32G X 4 MM Misc 1 Device by Does not apply route as directed.   Rybelsus 7 MG Tabs Generic drug: Semaglutide Take 7 mg by mouth daily.   simvastatin 20 MG tablet Commonly known as: ZOCOR Take 20 mg by mouth at bedtime.         DATA REVIEWED: 06/26/2018  BUN/Cr 16/1.44 mg/dL GFR 65 I3J 82.5 %     Results for Logan Davis, Logan Davis (MRN [id number]) as of 07/11/2019 08:15  Ref. Range 07/09/2019 16:43  Creatinine,U Latest Units: mg/dL 193.7  Microalb, Ur Latest Ref Range: 0.0 - 1.9 mg/dL 4.4 (H)  MICROALB/CREAT RATIO Latest Ref Range: 0.0 - 30.0 mg/g 0.9    In Office BG 110 mg/dL  ASSESSMENT / PLAN / RECOMMENDATIONS:   1) Type 2 Diabetes Mellitus, Optimally Controlled, Without  complications - Most recent A1c of 6.6 %. 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  We discussed switching this to oral Rybelsus, which the patient in agreement above    MEDICATIONS:  Continue Metformin 500 mg XR twice daily  Continue Farxiga 10 mg daily   Stop Trulicity   Start Rybelsus 7 mg daily   EDUCATION /  INSTRUCTIONS:  BG monitoring instructions: Patient is instructed to check his blood sugars 2 times a day, fasting and bedtime  Call [facility] if: BG persistently < 70  . I reviewed the Rule of 15 for the treatment of hypoglycemia in detail with the patient. Literature supplied.    2) Diabetic complications:   Eye: Does not have known diabetic retinopathy.   Neuro/ Feet: Does not have known diabetic peripheral neuropathy.  Renal: Patient does not have known baseline CKD. He is  on an ACEI/ARB at present.  Microalbumin      F/U in 4 months    Signed electronically by: Logan Herrlich, MD  [facility]  [facility] [address] [place], [place] [address] Phone: [phone] FAX: [phone]   CC: Logan Lions, Logan Davis [address] [place] [address] Phone: [phone]  Fax: [phone]  Return to Endocrinology clinic as below: Future Appointments  Date Time Provider Department Center  08/01/2020  1:20 PM Logan Davis, Logan Dolores, MD [facility] None

## 2020-09-02 IMAGING — MR MRI CERVICAL SPINE WITHOUT CONTRAST
4 of 5 series · 27 of 48 positions shown · non-contrast
Comparison: None.

CLINICAL DATA: Right arm and hand numbness

EXAM:
MRI CERVICAL SPINE WITHOUT CONTRAST
TECHNIQUE: Multiplanar, multisequence MR imaging of the cervical spine was
performed. No intravenous contrast was administered.

[Series 6: T1 · sagittal · 3.0mm · 0.66mm/px · 6 of 15 slices shown]
[im 1/15]
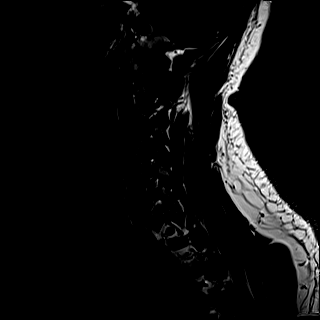
[im 3/15]
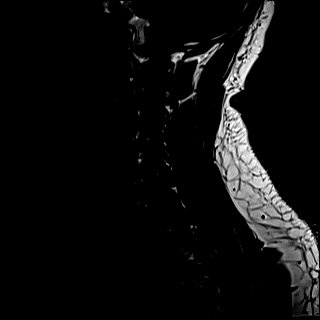
[im 6/15]
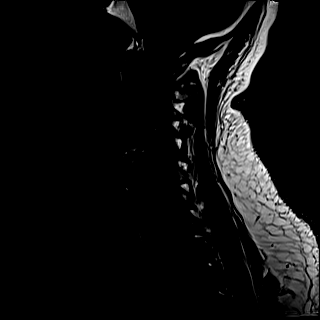
[im 9/15]
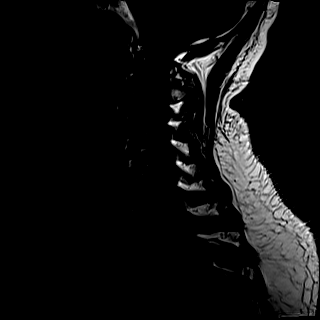
[im 12/15]
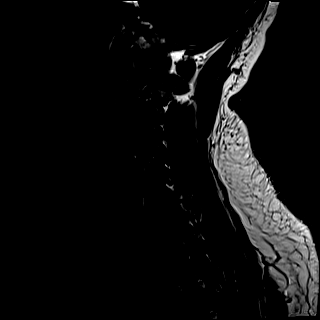
[im 15/15]
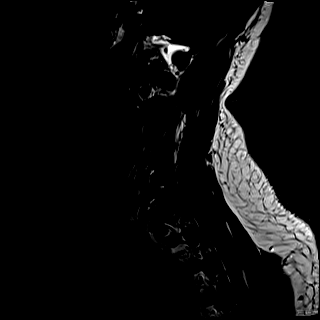

[Series 7: T2 · sagittal · 3.0mm · 0.55mm/px · 7 of 15 slices shown (1 of 2)]
[im 1/15]
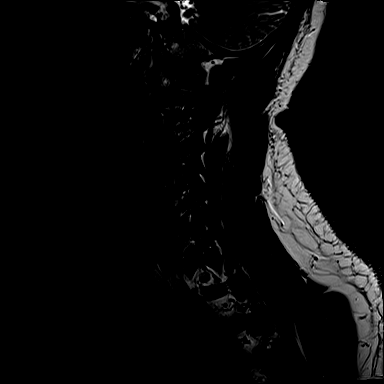
[im 3/15]
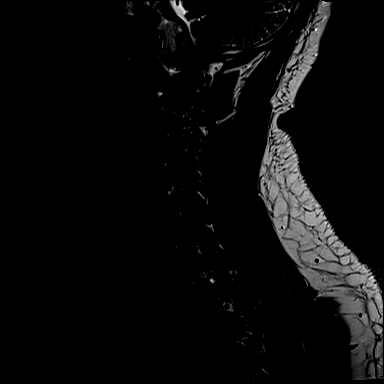
[im 5/15]
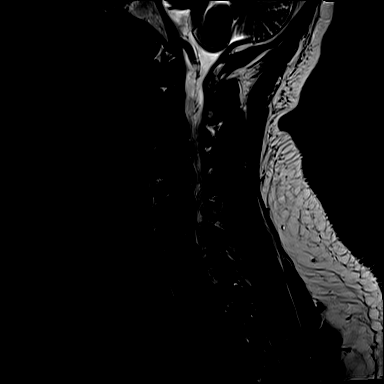
[im 8/15]
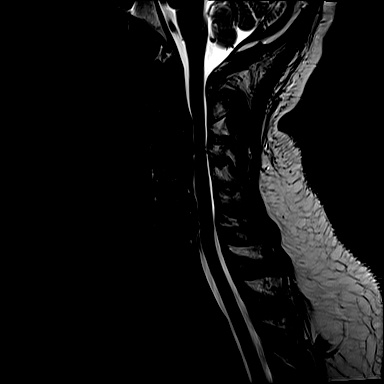
[im 10/15]
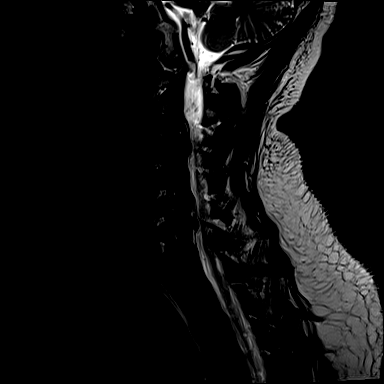
[im 12/15]
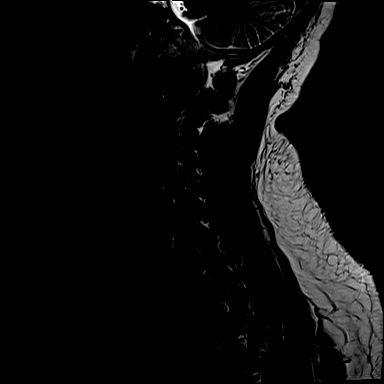
[im 15/15]
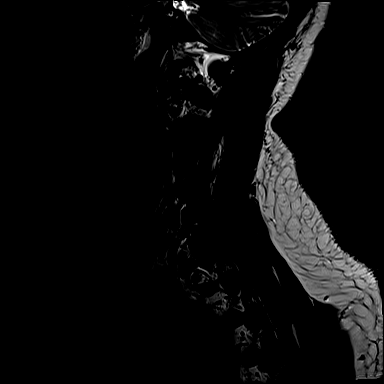

[Series 8: STIR · sagittal · 3.0mm · 0.33mm/px · 6 of 15 slices shown]
[im 1/15]
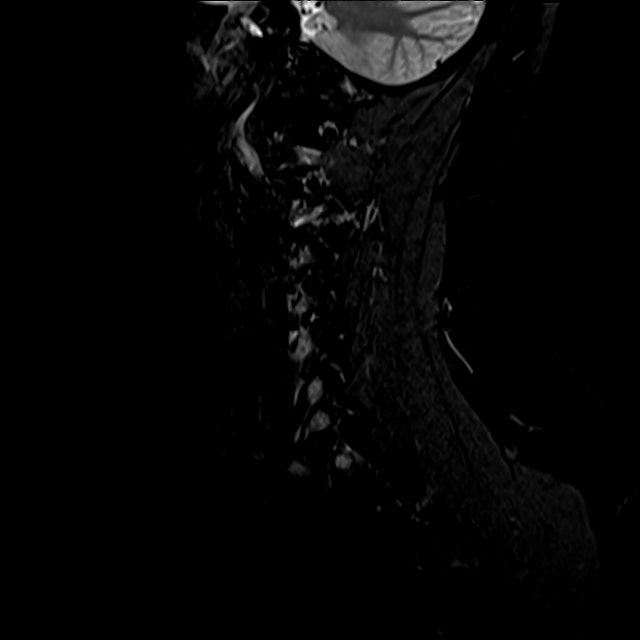
[im 3/15]
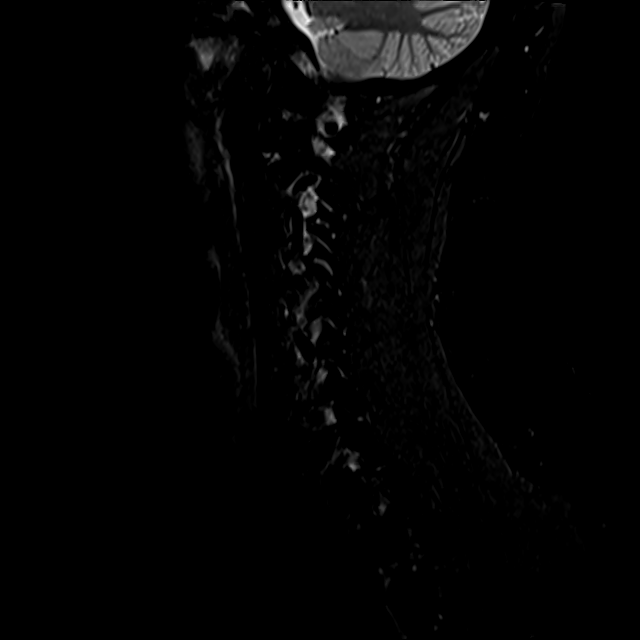
[im 5/15]
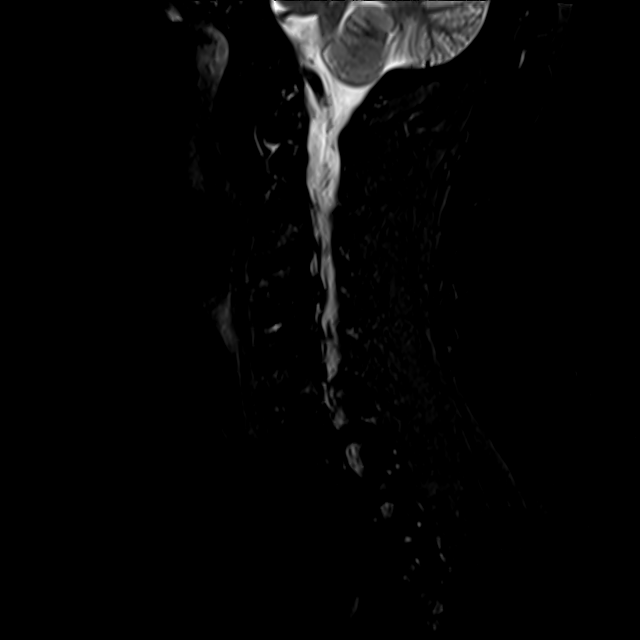
[im 8/15]
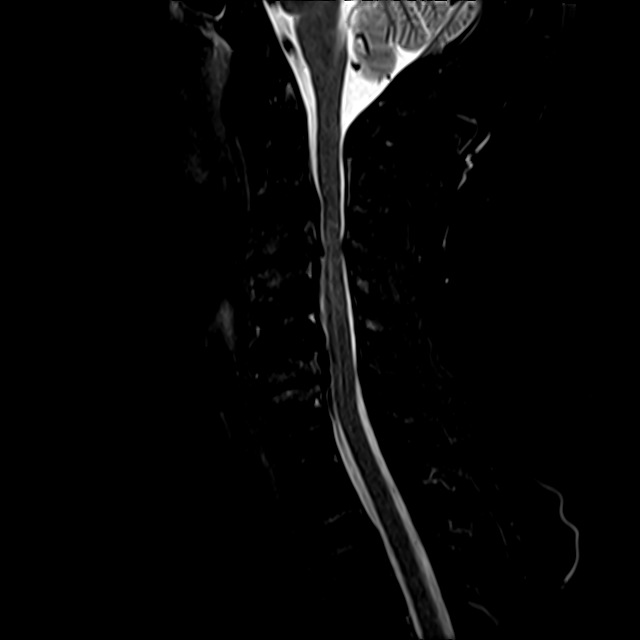
[im 10/15]
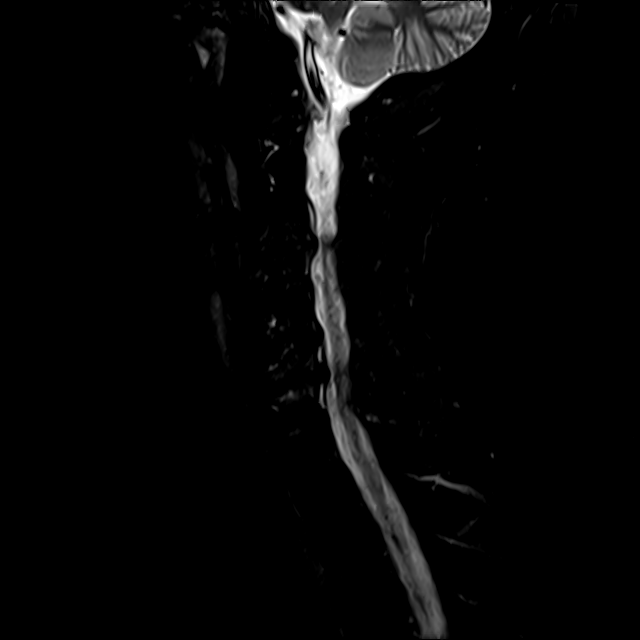
[im 12/15]
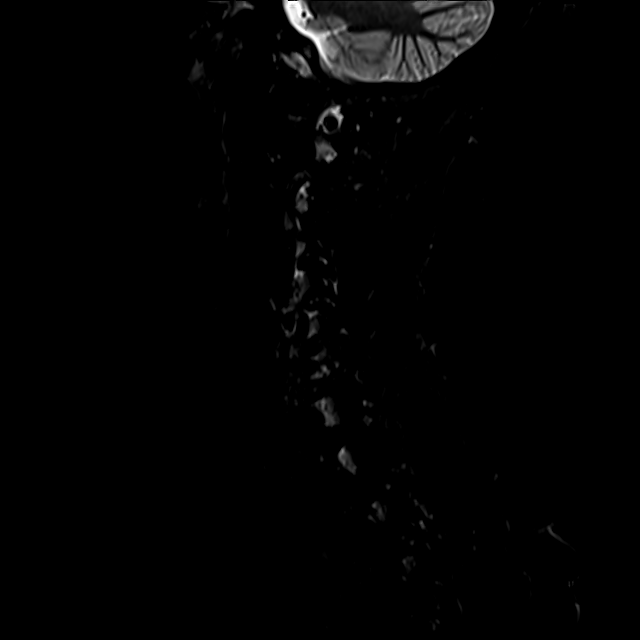

[Series 9: T2 · axial · 3.0mm · 0.50mm/px · z∈[-65,+35]mm · 8 of 32 slices shown (2 of 2)]
[im 1/32]
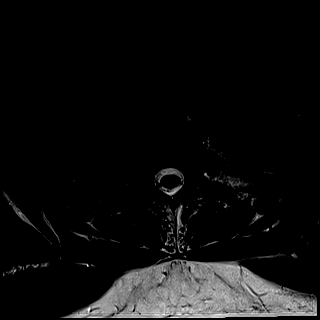
[im 5/32]
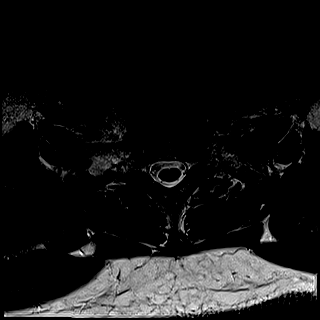
[im 10/32]
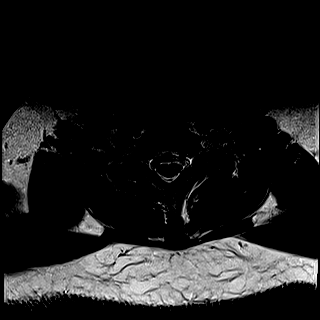
[im 15/32]
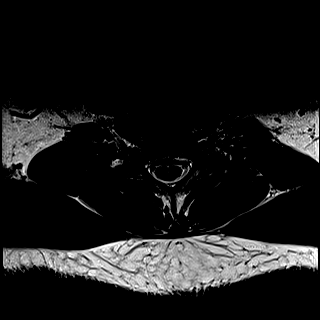
[im 17/32]
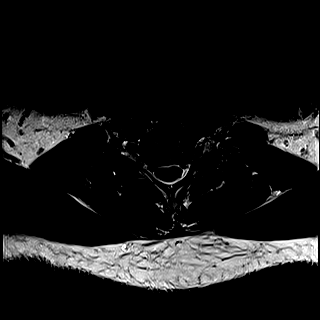
[im 22/32]
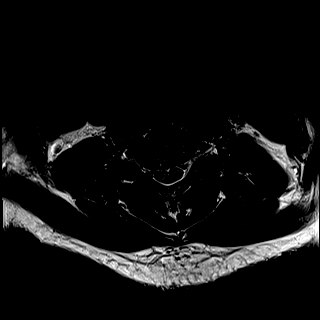
[im 27/32]
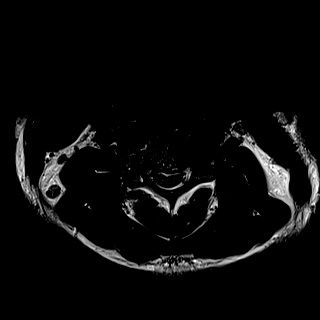
[im 32/32]
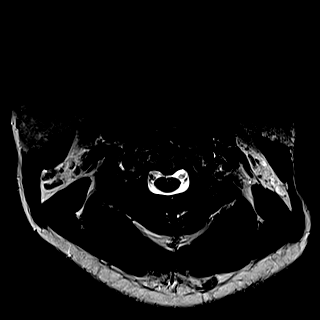

[27 of 48 positions shown; findings below may reference images not displayed]

FINDINGS: Alignment: Physiologic.

Vertebrae: No fracture, evidence of discitis, or bone lesion.

Cord: Normal morphology. Tiny focus of T2 hyperintensity in the
right paracentral aspect of the cervical spinal cord at the level of
C3-4 which may reflect a small area of myelomalacia given the
adjacent disc disease.

Posterior Fossa, vertebral arteries, paraspinal tissues: Posterior
fossa demonstrates no focal abnormality. Vertebral artery flow voids
are maintained. Paraspinal soft tissues are unremarkable.

Disc levels:

Discs: Degenerative disc disease with disc height loss at C2-3,
C3-4, C4-5, C5-6 and C6-7.

C2-3: Small central disc protrusion. No neural foraminal stenosis.
No central canal stenosis.

C3-4: Right paracentral disc protrusion contacting the ventral
cervical spinal cord. Mild bilateral facet arthropathy. Moderate
left and mild right foraminal stenosis. Mild spinal stenosis.

C4-5: No disc protrusion. Right uncovertebral degenerative changes.
Severe right foraminal stenosis. Mild left foraminal stenosis. No
central canal stenosis.

C5-6: Broad-based disc bulge. Moderate bilateral foraminal stenosis.
No central canal stenosis.

C6-7: Broad-based disc bulge. Moderate bilateral foraminal stenosis.
No central canal stenosis.

C7-T1: No significant disc bulge. No neural foraminal stenosis. No
central canal stenosis.
IMPRESSION: 1. Diffuse cervical spine spondylosis as described above.
2.  No acute osseous injury of the cervical spine.

## 2021-03-26 ENCOUNTER — Encounter: Payer: Self-pay | Admitting: Internal Medicine

## 2021-03-26 ENCOUNTER — Ambulatory Visit (INDEPENDENT_AMBULATORY_CARE_PROVIDER_SITE_OTHER): Payer: Self-pay | Admitting: Internal Medicine

## 2021-03-26 ENCOUNTER — Other Ambulatory Visit: Payer: Self-pay

## 2021-03-26 VITALS — BP 146/84 | HR 100 | Ht 69.5 in | Wt 201.6 lb

## 2021-03-26 DIAGNOSIS — I10 Essential (primary) hypertension: Secondary | ICD-10-CM | POA: Insufficient documentation

## 2021-03-26 DIAGNOSIS — E1165 Type 2 diabetes mellitus with hyperglycemia: Secondary | ICD-10-CM

## 2021-03-26 DIAGNOSIS — E785 Hyperlipidemia, unspecified: Secondary | ICD-10-CM

## 2021-03-26 DIAGNOSIS — Z794 Long term (current) use of insulin: Secondary | ICD-10-CM

## 2021-03-26 LAB — POCT GLYCOSYLATED HEMOGLOBIN (HGB A1C): Hemoglobin A1C: 10.4 % — AB (ref 4.0–5.6)

## 2021-03-26 LAB — GLUCOSE, POCT (MANUAL RESULT ENTRY): POC Glucose: 129 mg/dl — AB (ref 70–99)

## 2021-03-26 MED ORDER — HYDROCHLOROTHIAZIDE 25 MG PO TABS: 25.0000 mg | ORAL_TABLET | Freq: Every day | ORAL | 3 refills | Status: AC

## 2021-03-26 MED ORDER — NOVOLIN N FLEXPEN RELION 100 UNIT/ML ~~LOC~~ SUPN: 10.0000 [IU] | PEN_INJECTOR | Freq: Two times a day (BID) | SUBCUTANEOUS | 11 refills | Status: DC

## 2021-03-26 MED ORDER — LOSARTAN POTASSIUM 100 MG PO TABS: 100.0000 mg | ORAL_TABLET | Freq: Every day | ORAL | 3 refills | Status: DC

## 2021-03-26 MED ORDER — METFORMIN HCL ER 500 MG PO TB24: 1000.0000 mg | ORAL_TABLET | Freq: Two times a day (BID) | ORAL | 3 refills | Status: DC

## 2021-03-26 MED ORDER — INSULIN PEN NEEDLE 29G X 5MM MISC: 1.0000 | Freq: Two times a day (BID) | 6 refills | Status: DC

## 2021-03-26 MED ORDER — ATORVASTATIN CALCIUM 20 MG PO TABS: 20.0000 mg | ORAL_TABLET | Freq: Every day | ORAL | 3 refills | Status: AC

## 2021-03-26 NOTE — Progress Notes (Signed)
Name: Logan Davis  Age/ Sex: 55 y.o., male   MRN/ DOB: [id number], September 16, 1965     PCP: Rosemary Holms   Reason for Endocrinology Evaluation: Type 2 Diabetes Mellitus     Initial Endocrinology Clinic Visit: 09/11/2018    PATIENT IDENTIFIER: Logan Davis is a 55 y.o. male with a past medical history of HTN, dyslipidemia and T2DM. The patient has followed with Endocrinology clinic since 09/11/2018 for consultative assistance with management of his diabetes.  DIABETIC HISTORY:  Logan Davis was diagnosed with T2DM in  2018. He has reported intolerance to Metformin. He has tried [place] in the past without reported intolerance. Insulin has been started shortly after diagnosis.  His hemoglobin A1c has ranged from 8.7% in 2019, peaking at > 15.5% in 2018.   On his initial visit to our clinic his A1c was 13.0 %. He was on Farxiga (but had stopped it a month prior to his presentation),so we stopped it,  he was skipping lantus ~ 3-4 x a week . He was also on Victoza. We attempted to switch Victoza to trulicity but he opted to continue with Victoza.  In 11/2018 we started Marcelline Deist but he stopped taking it on his own 06/2019    farxiga and trulicity started 11/2018 SUBJECTIVE:   During the last visit (03/31/2020): A1c 6.6 %.  continued metformin, farxiga, and switched Trulicity to rybelsus     Today (03/26/2021): Logan Davis is here for a follow up on his diabetes management. He has not been to our clinic in 11 months.  He has lost his insurance and is unable to get his medications, except for metformin   He checks his blood sugars 0 times .   HOME DIABETES REGIMEN:  Metformin 500 mg XR 1 tab QAM and 2 QPM  Farxiga 10 mg daily -not taking Rybelsus 7 mg daily -not taking     METER DOWNLOAD SUMMARY: Did not bring  DIABETIC COMPLICATIONS: Microvascular complications:    Denies: neuropathy, retinopathy Last eye exam: Completed 02/2018   Macrovascular complications:     Denies: CAD, PVD, CVA  HISTORY:  Past Medical History:  Past Medical History:  Diagnosis Date   Cervical radiculopathy    Diabetes mellitus without complication (HCC)    Foot pain, right    Hyperlipidemia    Hypertension    Injury of cervical spine (HCC)    h/o   Lumbar radiculopathy    Plantar fasciitis    Past Surgical History: No past surgical history on file. Social History:  reports that he has never smoked. He has never used smokeless tobacco. He reports that he does not drink alcohol and does not use drugs. Family History:  Family History  Problem Relation Age of Onset   Diabetes Mother    Hypertension Mother    Hypertension Father    Diabetes Father    Diabetes Sister    Hypertension Sister    Hyperlipidemia Sister      HOME MEDICATIONS: Allergies as of 03/26/2021       Reactions   Sulfa Antibiotics Anaphylaxis        Medication List        Accurate as of March 26, 2021  7:07 AM. If you have any questions, ask your nurse or doctor.          amLODipine 10 MG tablet Commonly known as: NORVASC Take 10 mg by mouth daily.   atorvastatin 20 MG tablet Commonly known as:  LIPITOR Take 1 tablet (20 mg total) by mouth daily.   escitalopram 10 MG tablet Commonly known as: LEXAPRO Take 10 mg by mouth daily.   Farxiga 10 MG Tabs tablet Generic drug: dapagliflozin propanediol TAKE 1 TABLET BY MOUTH DAILY   gabapentin 800 MG tablet Commonly known as: NEURONTIN TAKE 1 TABLET(800 MG) BY MOUTH AT BEDTIME   hydrochlorothiazide 12.5 MG tablet Commonly known as: HYDRODIURIL Take 12.5 mg by mouth daily.   LIFESCAN FINEPOINT LANCETS Misc Use to check blood sugar 3 time(s) daily   losartan 50 MG tablet Commonly known as: COZAAR 100 mg.   losartan 100 MG tablet Commonly known as: COZAAR Take 100 mg by mouth daily.   meloxicam 15 MG tablet Commonly known as: MOBIC TAKE 1 TABLET(15 MG) BY MOUTH DAILY   metFORMIN 500 MG 24 hr tablet Commonly  known as: GLUCOPHAGE-XR TAKE 2 TABLETS(1000 MG) BY MOUTH TWICE DAILY   methocarbamol 500 MG tablet Commonly known as: ROBAXIN Take by mouth.   methylPREDNISolone 4 MG Tbpk tablet Commonly known as: MEDROL DOSEPAK 6 day dose pack - take as directed   naproxen 500 MG tablet Commonly known as: NAPROSYN Take 500 mg by mouth 2 (two) times daily.   OneTouch Verio test strip Generic drug: glucose blood Twice daily   Pen Needles 32G X 4 MM Misc 1 Device by Does not apply route as directed.   Rybelsus 7 MG Tabs Generic drug: Semaglutide Take 7 mg by mouth daily.   simvastatin 20 MG tablet Commonly known as: ZOCOR Take 20 mg by mouth at bedtime.          DATA REVIEWED: 06/26/2018  BUN/Cr 16/1.44 mg/dL GFR 65 B6L 84.5 %   Results for Logan Davis, Logan Davis (MRN [id number]) as of 03/26/2021 13:13  Ref. Range 03/12/2019 16:49  Sodium Latest Ref Range: 135 - 145 mEq/L 142  Potassium Latest Ref Range: 3.5 - 5.1 mEq/L 3.9  Chloride Latest Ref Range: 96 - 112 mEq/L 104  CO2 Latest Ref Range: 19 - 32 mEq/L 30  Glucose Latest Ref Range: 70 - 99 mg/dL 224 (H)  BUN Latest Ref Range: 6 - 23 mg/dL 15  Creatinine Latest Ref Range: 0.40 - 1.50 mg/dL 8.25  Calcium Latest Ref Range: 8.4 - 10.5 mg/dL 9.6  GFR Latest Ref Range: >60.00 mL/min 68.96    In Office BG 129  mg/dL  ASSESSMENT / PLAN / RECOMMENDATIONS:   1) Type 2 Diabetes Mellitus, Poorly Controlled, Without  complications - Most recent A1c of 10.4  %. Goal A1c < 7.0%  -A1c up from 6.6 % over the past 10 months, this is due to lack of glycemic agents due to loss of health insurance. -I am going to maximize on metformin dose, he will be started on NPH to [facility] brand insulin, in the meantime he was provided with patient assistance applications for Farxiga and Rybelsus -I have emphasized the importance of taking NPH at bedtime, to reduce the risk of hypoglycemia    MEDICATIONS: Increase metformin 500 mg XR 2 tablets twice  daily Start ReliOn -N 10 units every morning and 10 units at bedtime Continue Farxiga 10 mg daily  Rybelsus 7 mg daily   EDUCATION / INSTRUCTIONS: BG monitoring instructions: Patient is instructed to check his blood sugars 2 times a day, fasting and bedtime Call [facility] if: BG persistently < 70  I reviewed the Rule of 15 for the treatment of hypoglycemia in detail with the patient. Literature supplied.  2) Diabetic complications:  Eye: Does not have known diabetic retinopathy.  Neuro/ Feet: Does not have known diabetic peripheral neuropathy. Renal: Patient does not have known baseline CKD. He is  on an ACEI/ARB at present. Microalbumin    3) HTN:   -BP slightly above goal we will refill HCTZ and losartan    3) Dyslipidemia:   -I have refilled atorvastatin 20 mg daily   Of note the patient was given printed prescriptions of all his medications today to take to [facility]  F/U in 4 months    Signed electronically by: Lyndle Herrlich, MD  [facility] Endocrinology  [facility] [address] [place], [place] [address] Phone: [phone] FAX: [phone]   CC: Bryon Lions, PA-C [address] [place] [address] Phone: [phone]  Fax: [phone]  Return to Endocrinology clinic as below: Future Appointments  Date Time Provider Department Center  03/26/2021  7:30 AM Shanin Szymanowski, Konrad Dolores, MD [facility] None

## 2021-03-26 NOTE — Patient Instructions (Addendum)
-   INcrease Metformin 500 mg 2 tablets twice a day  - Start ReliOn- N 10 units in the morning and 10 units at Bedtime    HOW TO TREAT LOW BLOOD SUGARS (Blood sugar LESS THAN 70 MG/DL) Please follow the RULE OF 15 for the treatment of hypoglycemia treatment (when your (blood sugars are less than 70 mg/dL)   STEP 1: Take 15 grams of carbohydrates when your blood sugar is low, which includes:  3-4 GLUCOSE TABS  OR 3-4 OZ OF JUICE OR REGULAR SODA OR ONE TUBE OF GLUCOSE GEL    STEP 2: RECHECK blood sugar in 15 MINUTES STEP 3: If your blood sugar is still low at the 15 minute recheck --> then, go back to STEP 1 and treat AGAIN with another 15 grams of carbohydrates.

## 2021-07-03 DIAGNOSIS — E669 Obesity, unspecified: Secondary | ICD-10-CM | POA: Diagnosis not present

## 2021-07-03 DIAGNOSIS — Z23 Encounter for immunization: Secondary | ICD-10-CM | POA: Diagnosis not present

## 2021-07-03 DIAGNOSIS — Z794 Long term (current) use of insulin: Secondary | ICD-10-CM | POA: Diagnosis not present

## 2021-07-03 DIAGNOSIS — E1165 Type 2 diabetes mellitus with hyperglycemia: Secondary | ICD-10-CM | POA: Diagnosis not present

## 2021-07-03 DIAGNOSIS — G5621 Lesion of ulnar nerve, right upper limb: Secondary | ICD-10-CM | POA: Diagnosis not present

## 2021-07-03 DIAGNOSIS — G5711 Meralgia paresthetica, right lower limb: Secondary | ICD-10-CM | POA: Diagnosis not present

## 2021-07-03 DIAGNOSIS — I10 Essential (primary) hypertension: Secondary | ICD-10-CM | POA: Diagnosis not present

## 2021-07-17 ENCOUNTER — Other Ambulatory Visit: Payer: Self-pay

## 2021-07-17 ENCOUNTER — Ambulatory Visit (INDEPENDENT_AMBULATORY_CARE_PROVIDER_SITE_OTHER): Payer: Self-pay | Admitting: Internal Medicine

## 2021-07-17 ENCOUNTER — Encounter: Payer: Self-pay | Admitting: Internal Medicine

## 2021-07-17 VITALS — BP 130/78 | HR 106 | Ht 69.5 in | Wt 199.2 lb

## 2021-07-17 DIAGNOSIS — E785 Hyperlipidemia, unspecified: Secondary | ICD-10-CM

## 2021-07-17 DIAGNOSIS — Z794 Long term (current) use of insulin: Secondary | ICD-10-CM

## 2021-07-17 DIAGNOSIS — E1165 Type 2 diabetes mellitus with hyperglycemia: Secondary | ICD-10-CM

## 2021-07-17 LAB — POCT GLUCOSE (DEVICE FOR HOME USE): Glucose Fasting, POC: 153 mg/dL — AB (ref 70–99)

## 2021-07-17 LAB — POCT GLYCOSYLATED HEMOGLOBIN (HGB A1C): Hemoglobin A1C: 8.2 % — AB (ref 4.0–5.6)

## 2021-07-17 MED ORDER — DAPAGLIFLOZIN PROPANEDIOL 10 MG PO TABS: 10.0000 mg | ORAL_TABLET | Freq: Every day | ORAL | 3 refills | Status: DC

## 2021-07-17 MED ORDER — METFORMIN HCL ER 500 MG PO TB24: 1000.0000 mg | ORAL_TABLET | Freq: Two times a day (BID) | ORAL | 3 refills | Status: DC

## 2021-07-17 NOTE — Progress Notes (Signed)
Name: Logan Davis  Age/ Sex: 56 y.o., male   MRN/ DOB: [id number], [birth year]     PCP: Rosemary Holms   Reason for Endocrinology Evaluation: Type 2 Diabetes Mellitus     Initial Endocrinology Clinic Visit: 09/11/2018    PATIENT IDENTIFIER: Logan Davis is a 56 y.o. male with a past medical history of HTN, dyslipidemia and T2DM. The patient has followed with Endocrinology clinic since 09/11/2018 for consultative assistance with management of his diabetes.  DIABETIC HISTORY:  Mr. Albus was diagnosed with T2DM in  2018. He has reported intolerance to Metformin. He has tried [place] in the past without reported intolerance. Insulin has been started shortly after diagnosis.  His hemoglobin A1c has ranged from 8.7% in 2019, peaking at > 15.5% in 2018.   On his initial visit to our clinic his A1c was 13.0 %. He was on Farxiga (but had stopped it a month prior to his presentation),so we stopped it,  he was skipping lantus ~ 3-4 x a week . He was also on Victoza. We attempted to switch Victoza to trulicity but he opted to continue with Victoza.  In 11/2018 we started [place] but he stopped taking it on his own 06/2019    farxiga and trulicity started 11/2018   NPH started 02/2021 SUBJECTIVE:   During the last visit (03/26/2021): A1c 10.4  %.  We increased  metformin, continued farxiga, and  rybelsus and started NPH     Today (07/17/2021): [name] is here for a follow up on his diabetes management.  He is accompanied by his wife today.  He has not been checking glucose .  He has not been taking NPH, he has only been taking metformin  He has cut out sugar sweetened beverages   He was without [place] nor Rybelsus due to loss of health insurance, but he has been able to restart Marcelline Deist   denies nausea, vomiting or diarrhea    HOME DIABETES REGIMEN:  Metformin 500 mg XR 2 tabs BID Farxiga 10 mg daily - no taking  Rybelsus 7 mg daily - not taking  NPH 10 units BID -  not taking     METER DOWNLOAD SUMMARY: Did not bring      DIABETIC COMPLICATIONS: Microvascular complications:    Denies: neuropathy, retinopathy Last eye exam: Completed 02/2018   Macrovascular complications:    Denies: CAD, PVD, CVA  HISTORY:  Past Medical History:  Past Medical History:  Diagnosis Date   Cervical radiculopathy    Diabetes mellitus without complication (HCC)    Foot pain, right    Hyperlipidemia    Hypertension    Injury of cervical spine (HCC)    h/o   Lumbar radiculopathy    Plantar fasciitis    Past Surgical History: No past surgical history on file. Social History:  reports that he has never smoked. He has never used smokeless tobacco. He reports that he does not drink alcohol and does not use drugs. Family History:  Family History  Problem Relation Age of Onset   Diabetes Mother    Hypertension Mother    Hypertension Father    Diabetes Father    Diabetes Sister    Hypertension Sister    Hyperlipidemia Sister      HOME MEDICATIONS: Allergies as of 07/17/2021       Reactions   Sulfa Antibiotics Anaphylaxis        Medication List  Accurate as of July 17, 2021 12:41 PM. If you have any questions, ask your nurse or doctor.          atorvastatin 20 MG tablet Commonly known as: LIPITOR Take 1 tablet (20 mg total) by mouth daily.   escitalopram 10 MG tablet Commonly known as: LEXAPRO Take 10 mg by mouth daily.   Farxiga 10 MG Tabs tablet Generic drug: dapagliflozin propanediol TAKE 1 TABLET BY MOUTH DAILY   gabapentin 800 MG tablet Commonly known as: NEURONTIN TAKE 1 TABLET(800 MG) BY MOUTH AT BEDTIME   hydrochlorothiazide 25 MG tablet Commonly known as: HYDRODIURIL Take 1 tablet (25 mg total) by mouth daily.   Insulin Pen Needle 29G X 5MM Misc 1 Device by Does not apply route in the morning and at bedtime.   losartan 100 MG tablet Commonly known as: COZAAR Take 1 tablet (100 mg total) by mouth  daily.   metFORMIN 500 MG 24 hr tablet Commonly known as: GLUCOPHAGE-XR Take 2 tablets (1,000 mg total) by mouth in the morning and at bedtime.   NovoLIN N FlexPen ReliOn 100 UNIT/ML Kiwkpen Generic drug: Insulin NPH (Human) (Isophane) Inject 10 Units into the skin in the morning and at bedtime.   Rybelsus 7 MG Tabs Generic drug: Semaglutide Take 7 mg by mouth daily.        OBJECTIVE:   Vital Signs: BP 130/78    Pulse (!) 106    Ht 5' 9.5" (1.765 m)    Wt 199 lb 3.2 oz (90.4 kg)    SpO2 99%    BMI 28.99 kg/m     Exam: General: Pt appears well and is in NAD  Lungs: Clear with good BS bilat with no rales, rhonchi, or wheezes  Heart: RRR with normal S1 and S2 and no gallops; no murmurs; no rub  Abdomen: Normoactive bowel sounds, soft, non tender  Extremities: No pretibial edema.   Neuro: MS is good with appropriate affect, pt is alert and Ox3   DM Foot Exam 07/17/2021  The skin of the feet is intact without sores or ulcerations. The pedal pulses are 2+ on right and 2+ on left. The sensation is intact to a screening 5.07, 10 gram monofilament bilaterally   DATA REVIEWED:  Labs done at PCPs office~2 weeks ago Per patient will obtain these   ASSESSMENT / PLAN / RECOMMENDATIONS:   1) Type 2 Diabetes Mellitus, with improving glycemic control, Without  complications - Most recent A1c of 8.2   %. Goal A1c < 7.0%  -A1c has decreased from 10.4% to 8.2% which is metformin, he has been able to restart [place], as he has insurance coverage now -Patient tells me he had basic labs done at PCPs office, will obtain these    MEDICATIONS: Continue metformin 500 mg XR 2 tablets twice daily Continue Farxiga 10 mg daily    EDUCATION / INSTRUCTIONS: BG monitoring instructions: Patient is instructed to check his blood sugars 2 times a day, fasting and bedtime Call [place] [facility] if: BG persistently < 70  I reviewed the Rule of 15 for the treatment of hypoglycemia in  detail with the patient. Literature supplied.    2) Diabetic complications:  Eye: Does not have known diabetic retinopathy.  Neuro/ Feet: Does not have known diabetic peripheral neuropathy. Renal: Patient does not have known baseline CKD. He is  on an ACEI/ARB at present. Microalbumin     3) Dyslipidemia:  -Patient on atorvastatin 20 mg daily.  Unknown most recent  lipid panel, will obtain these from PCP    F/U in 6 months    Signed electronically by: Lyndle Herrlich, MD  [facility] Endocrinology  [facility] [address] [place], [place] [address] Phone: [phone] FAX: [phone]   CC: Bryon Lions, PA-C [address] [place] [address] Phone: [phone]  Fax: [phone]  Return to Endocrinology clinic as below: Future Appointments  Date Time Provider Department Center  07/17/2021  1:20 PM Orine Goga, Konrad Dolores, MD [facility] None

## 2021-07-17 NOTE — Patient Instructions (Signed)
-   Continue  Metformin 500 mg 2 tablets twice a day  - Continue Farxiga 10 mg daily     HOW TO TREAT LOW BLOOD SUGARS (Blood sugar LESS THAN 70 MG/DL) Please follow the RULE OF 15 for the treatment of hypoglycemia treatment (when your (blood sugars are less than 70 mg/dL)   STEP 1: Take 15 grams of carbohydrates when your blood sugar is low, which includes:  3-4 GLUCOSE TABS  OR 3-4 OZ OF JUICE OR REGULAR SODA OR ONE TUBE OF GLUCOSE GEL    STEP 2: RECHECK blood sugar in 15 MINUTES STEP 3: If your blood sugar is still low at the 15 minute recheck --> then, go back to STEP 1 and treat AGAIN with another 15 grams of carbohydrates.

## 2021-07-20 DIAGNOSIS — M5030 Other cervical disc degeneration, unspecified cervical region: Secondary | ICD-10-CM | POA: Diagnosis not present

## 2021-07-20 DIAGNOSIS — M47812 Spondylosis without myelopathy or radiculopathy, cervical region: Secondary | ICD-10-CM | POA: Diagnosis not present

## 2021-07-20 DIAGNOSIS — G959 Disease of spinal cord, unspecified: Secondary | ICD-10-CM | POA: Diagnosis not present

## 2021-11-11 ENCOUNTER — Other Ambulatory Visit: Payer: Self-pay

## 2021-11-11 MED ORDER — DAPAGLIFLOZIN PROPANEDIOL 10 MG PO TABS: 10.0000 mg | ORAL_TABLET | Freq: Every day | ORAL | 1 refills | Status: DC

## 2021-11-12 ENCOUNTER — Telehealth: Payer: Self-pay

## 2021-11-12 ENCOUNTER — Other Ambulatory Visit (HOSPITAL_COMMUNITY): Payer: Self-pay

## 2021-11-12 NOTE — Telephone Encounter (Signed)
error 

## 2021-11-12 NOTE — Telephone Encounter (Signed)
Patient Advocate Encounter  Prior Authorization for Farxiga 10MG  tablets has been approved.    PA# Effective dates: 10/13/2021 through 11/12/2022  Patients co-pay is $0. (90 day supply)

## 2021-11-12 NOTE — Telephone Encounter (Signed)
Patient Advocate Encounter   Received notification that prior authorization for Farxiga 10MG  tablets is required.   PA submitted on 11/12/2021 Key : [id number]/[id number] Status is pending

## 2021-12-16 ENCOUNTER — Other Ambulatory Visit: Payer: Self-pay | Admitting: Podiatry

## 2022-01-11 ENCOUNTER — Encounter: Payer: Self-pay | Admitting: Internal Medicine

## 2022-01-11 ENCOUNTER — Ambulatory Visit (INDEPENDENT_AMBULATORY_CARE_PROVIDER_SITE_OTHER): Payer: Self-pay | Admitting: Internal Medicine

## 2022-01-11 VITALS — BP 140/90 | HR 100 | Ht 69.5 in | Wt 191.0 lb

## 2022-01-11 DIAGNOSIS — E785 Hyperlipidemia, unspecified: Secondary | ICD-10-CM

## 2022-01-11 DIAGNOSIS — Z794 Long term (current) use of insulin: Secondary | ICD-10-CM

## 2022-01-11 DIAGNOSIS — E1165 Type 2 diabetes mellitus with hyperglycemia: Secondary | ICD-10-CM

## 2022-01-11 LAB — POCT GLYCOSYLATED HEMOGLOBIN (HGB A1C): Hemoglobin A1C: 12.5 % — AB (ref 4.0–5.6)

## 2022-01-11 LAB — POCT GLUCOSE (DEVICE FOR HOME USE): POC Glucose: 296 mg/dl — AB (ref 70–99)

## 2022-01-11 MED ORDER — FREESTYLE LIBRE 2 SENSOR MISC: 1.0000 | 3 refills | Status: AC

## 2022-01-11 MED ORDER — ONETOUCH VERIO VI STRP: 1.0000 | ORAL_STRIP | Freq: Every day | 3 refills | Status: AC

## 2022-01-11 MED ORDER — INSULIN PEN NEEDLE 29G X 5MM MISC: 1.0000 | Freq: Every day | 3 refills | Status: DC

## 2022-01-11 MED ORDER — SEMAGLUTIDE(0.25 OR 0.5MG/DOS) 2 MG/3ML ~~LOC~~ SOPN: 0.5000 mg | PEN_INJECTOR | SUBCUTANEOUS | 3 refills | Status: DC

## 2022-01-11 MED ORDER — DAPAGLIFLOZIN PROPANEDIOL 10 MG PO TABS: 10.0000 mg | ORAL_TABLET | Freq: Every day | ORAL | 3 refills | Status: DC

## 2022-01-11 MED ORDER — METFORMIN HCL ER 500 MG PO TB24: 1000.0000 mg | ORAL_TABLET | Freq: Two times a day (BID) | ORAL | 3 refills | Status: DC

## 2022-01-11 MED ORDER — LANTUS SOLOSTAR 100 UNIT/ML ~~LOC~~ SOPN: 18.0000 [IU] | PEN_INJECTOR | Freq: Every day | SUBCUTANEOUS | 11 refills | Status: DC

## 2022-01-11 NOTE — Patient Instructions (Addendum)
-   Continue  Metformin 500 mg 2 tablets twice a day  - Continue Farxiga 10 mg daily  - Start Ozempic 0.25 mg once a week for 6 weeks, than increase to 0.5 mg weekly  - Start Lantus 18 units daily     HOW TO TREAT LOW BLOOD SUGARS (Blood sugar LESS THAN 70 MG/DL) Please follow the RULE OF 15 for the treatment of hypoglycemia treatment (when your (blood sugars are less than 70 mg/dL)   STEP 1: Take 15 grams of carbohydrates when your blood sugar is low, which includes:  3-4 GLUCOSE TABS  OR 3-4 OZ OF JUICE OR REGULAR SODA OR ONE TUBE OF GLUCOSE GEL    STEP 2: RECHECK blood sugar in 15 MINUTES STEP 3: If your blood sugar is still low at the 15 minute recheck --> then, go back to STEP 1 and treat AGAIN with another 15 grams of carbohydrates.

## 2022-01-11 NOTE — Progress Notes (Signed)
Name: Logan Davis  Age/ Sex: 56 y.o., male   MRN/ DOB: [id number], [birth year]     PCP: Rosemary Holms   Reason for Endocrinology Evaluation: Type 2 Diabetes Mellitus     Initial Endocrinology Clinic Visit: 09/11/2018    PATIENT IDENTIFIER: Logan Davis is a 56 y.o. male with a past medical history of HTN, dyslipidemia and T2DM. The patient has followed with Endocrinology clinic since 09/11/2018 for consultative assistance with management of his diabetes.  DIABETIC HISTORY:  Logan Davis was diagnosed with T2DM in  2018. He has reported intolerance to Metformin. He has tried Comoros in the past without reported intolerance. Insulin has been started shortly after diagnosis.  His hemoglobin A1c has ranged from 8.7% in 2019, peaking at > 15.5% in 2018.   On his initial visit to our clinic his A1c was 13.0 %. He was on Farxiga (but had stopped it a month prior to his presentation),so we stopped it,  he was skipping lantus ~ 3-4 x a week . He was also on Victoza. We attempted to switch Victoza to trulicity but he opted to continue with Victoza.  In 11/2018 we started [place] but he stopped taking it on his own 06/2019    farxiga and trulicity started 11/2018 By 06/2021 was on  farxiga and Metformin only    NPH started 02/2021 SUBJECTIVE:   During the last visit (07/17/2021): A1c 8.2 %.  We continued  metformin, and farxiga   Today (01/11/2022): Logan Davis is here for a follow up on his diabetes management. He has not been checking glucose .     Denies nausea, vomiting or diarrhea    HOME DIABETES REGIMEN:  Metformin 500 mg XR 2 tabs BID Farxiga 10 mg daily    METER DOWNLOAD SUMMARY: Did not bring      DIABETIC COMPLICATIONS: Microvascular complications:    Denies: neuropathy, retinopathy Last eye exam: Completed 02/2018   Macrovascular complications:    Denies: CAD, PVD, CVA  HISTORY:  Past Medical History:  Past Medical History:  Diagnosis Date    Cervical radiculopathy    Diabetes mellitus without complication (HCC)    Foot pain, right    Hyperlipidemia    Hypertension    Injury of cervical spine (HCC)    h/o   Lumbar radiculopathy    Plantar fasciitis    Past Surgical History: No past surgical history on file. Social History:  reports that he has never smoked. He has never used smokeless tobacco. He reports that he does not drink alcohol and does not use drugs. Family History:  Family History  Problem Relation Age of Onset   Diabetes Mother    Hypertension Mother    Hypertension Father    Diabetes Father    Diabetes Sister    Hypertension Sister    Hyperlipidemia Sister      HOME MEDICATIONS: Allergies as of 01/11/2022       Reactions   Sulfa Antibiotics Anaphylaxis        Medication List        Accurate as of January 11, 2022  2:30 PM. If you have any questions, ask your nurse or doctor.          atorvastatin 20 MG tablet Commonly known as: LIPITOR Take 1 tablet (20 mg total) by mouth daily.   dapagliflozin propanediol 10 MG Tabs tablet Commonly known as: Farxiga Take 1 tablet (10 mg total) by mouth daily before breakfast.  escitalopram 10 MG tablet Commonly known as: LEXAPRO Take 10 mg by mouth daily.   gabapentin 800 MG tablet Commonly known as: NEURONTIN TAKE 1 TABLET(800 MG) BY MOUTH AT BEDTIME   hydrochlorothiazide 25 MG tablet Commonly known as: HYDRODIURIL Take 1 tablet (25 mg total) by mouth daily.   Insulin Pen Needle 29G X Misc 1 Device by Does not apply route in the morning and at bedtime.   losartan 100 MG tablet Commonly known as: COZAAR Take 1 tablet (100 mg total) by mouth daily.   metFORMIN 500 MG 24 hr tablet Commonly known as: GLUCOPHAGE-XR Take 2 tablets (1,000 mg total) by mouth in the morning and at bedtime.        OBJECTIVE:   Vital Signs: BP 140/90 (BP Location: Left Arm, Patient Position: Sitting, Cuff Size: Large)   Pulse 100   Ht 5' 9.5" (1.765 m)    Wt 191 lb (86.6 kg)   SpO2 97%   BMI 27.80 kg/m     Exam: General: Pt appears well and is in NAD  Lungs: Clear with good BS bilat with no rales, rhonchi, or wheezes  Heart: RRR with normal S1 and S2 and no gallops; no murmurs; no rub  Abdomen: Normoactive bowel sounds, soft, non tender  Extremities: No pretibial edema.   Neuro: MS is good with appropriate affect, pt is alert and Ox3   DM Foot Exam 07/17/2021  The skin of the feet is intact without sores or ulcerations. The pedal pulses are 2+ on right and 2+ on left. The sensation is intact to a screening 5.07, 10 gram monofilament bilaterally   DATA REVIEWED:  Latest Reference Range & Units 01/11/22 14:22 01/11/22 14:26  POC Glucose 70 - 99 mg/dl 161 !   Hemoglobin A1C 4.0 - 5.6 %  12.5 !  !: Data is abnormal  ASSESSMENT / PLAN / RECOMMENDATIONS:   1) Type 2 Diabetes Mellitus, poorly controlled without  complications - Most recent A1c of 12.5 %. 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 -We will also start him on Ozempic once weekly, used to be on Rybelsus without any side effects -A prescription for Freestyle libre was sent to the pharmacy, and a sample was provided today   MEDICATIONS: Continue metformin 500 mg XR 2 tablets twice daily Continue Farxiga 10 mg daily  Start Ozempic 0.25 mg once weekly for 6 weeks, then increase to 0.5 mg weekly Start Lantus 18 units daily   EDUCATION / INSTRUCTIONS: BG monitoring instructions: Patient is instructed  to check his blood sugars 2 times a day, fasting and bedtime Call [place] [facility] if: BG persistently < 70  I reviewed the Rule of 15 for the treatment of hypoglycemia in detail with the patient. Literature supplied.    2) Diabetic complications:  Eye: Does not have known diabetic retinopathy.  Neuro/ Feet: Does not have known diabetic peripheral neuropathy. Renal: Patient does not have known baseline CKD. He is  on  an ACEI/ARB at present. Microalbumin     3) Dyslipidemia:  -Patient on atorvastatin 20 mg daily.    F/U in 4 months    Signed electronically by: Lyndle Herrlich, MD  [facility] Endocrinology  [facility] [address] [place], [place] [address] Phone: [phone] FAX: [phone]   CC: Bryon Lions, PA-C [address] [place] [address] Phone: [phone]  Fax: [phone]  Return to Endocrinology clinic as below: No future appointments.

## 2022-01-26 ENCOUNTER — Ambulatory Visit: Payer: Self-pay

## 2022-02-16 ENCOUNTER — Ambulatory Visit: Payer: Self-pay

## 2022-02-16 NOTE — Patient Outreach (Signed)
  Care Coordination   02/16/2022 Name: Logan Davis MRN: [id number] DOB: 1965/10/04   Care Coordination Outreach Attempts:  An unsuccessful telephone outreach was attempted today to offer the patient information about available care coordination services as a benefit of their health plan.   Follow Up Plan:  Additional outreach attempts will be made to offer the patient care coordination information and services.   Encounter Outcome:  No Answer  Care Coordination Interventions Activated:  No   Care Coordination Interventions:  No, not indicated   [facility] Care Management [phone]

## 2022-02-16 NOTE — Patient Instructions (Signed)
Visit Information Thank you for allowing the Care Management team to participate in your care. It was great speaking with you today! Be safe while traveling. Please do not hesitate to contact me if you require assistance prior to our next outreach.  Following are the goals we discussed today:   Goals Addressed             This Visit's Progress    Health Maintenance       Care Coordination Interventions: Reviewed plan for disease management. Reports adhering to plan and attending medical appointments as scheduled. Reviewed medications. Reports managing well. Denies current concerns r/t medication management or prescription cost. Currently due for an annual physical. 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 Anticipates being available for a clinic visit with his PCP in late October. Agreed to contact if scheduling assistance is needed.        Our next appointment is by telephone on May 14, 2022 at 2:30 pm Please call the care guide team at [phone] if you need to cancel or reschedule your appointment.   Logan Davis verbalized understanding of the information discussed during the telephonic outreach. Declined need for mailed instructions or resources.    A member of the care management team will follow up in November.  [facility] Care Management [phone]

## 2022-02-16 NOTE — Patient Outreach (Signed)
  Care Coordination   Initial Visit Note   02/16/2022 Name: Logan Davis MRN: [id number] DOB: 05-19-1966  Logan Davis is a 56 y.o. year old male who sees Logan Davis, [place] for primary care. I spoke with  Logan Davis by phone today  What matters to the patients health and wellness today?  Health Maintenance    Goals Addressed             This Visit's Progress    Health Maintenance       Care Coordination Interventions: Reviewed plan for disease management. Reports adhering to plan and attending medical appointments as scheduled. Reviewed medications. Reports managing well. Denies current concerns r/t medication management or prescription cost. Currently due for an annual physical. 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 Anticipates being available for a clinic visit with his PCP in late October. Agreed to contact if scheduling assistance is needed.        SDOH assessments and interventions completed:  Yes  SDOH Interventions Today    Flowsheet Row Most Recent Value  SDOH Interventions   Food Insecurity Interventions Intervention Not Indicated  Transportation Interventions Intervention Not Indicated        Care Coordination Interventions Activated:  Yes  Care Coordination Interventions:  Yes, provided   Follow up plan: Follow up call scheduled for May 14, 2022.    Encounter Outcome:  Pt. Visit Completed   [facility] Care Management [phone]

## 2022-02-16 NOTE — Patient Outreach (Signed)
  Care Coordination    Name: SAKETH DAUBERT MRN: [id number] DOB: 1966/06/25   Care Coordination Outreach Attempts:  An unsuccessful telephone outreach was attempted today to offer the patient information about available care coordination services as a benefit of their health plan.   Follow Up Plan:  Additional outreach attempts will be made to offer the patient care coordination information and services.   Encounter Outcome:  No Answer  Care Coordination Interventions Activated:  No   Care Coordination Interventions:  No, not indicated    [facility] Care Management [phone]

## 2022-04-05 ENCOUNTER — Other Ambulatory Visit (HOSPITAL_COMMUNITY): Payer: Self-pay

## 2022-04-05 ENCOUNTER — Telehealth: Payer: Self-pay

## 2022-04-05 NOTE — Telephone Encounter (Signed)
Patient Advocate Encounter  Prior Authorization for Ozempic (0.25 or 0.5 MG/DOSE) 2MG /3ML pen-injectors has been approved.    Key: [facility] to confirm, patient was able to pick up a 3 month supply. Copay $75.00

## 2022-11-18 ENCOUNTER — Telehealth: Payer: Self-pay

## 2022-11-18 ENCOUNTER — Other Ambulatory Visit (HOSPITAL_COMMUNITY): Payer: Self-pay

## 2022-11-18 NOTE — Telephone Encounter (Signed)
Patient Advocate Encounter   Received notification from [facility] that prior authorization is required for Ozempic   Submitted: 11/18/22 Peggye Pitt  PA was APPROVED  Effective dates: 11/18/22 through 11/18/23

## 2023-02-01 ENCOUNTER — Other Ambulatory Visit: Payer: Self-pay

## 2023-02-01 MED ORDER — SEMAGLUTIDE(0.25 OR 0.5MG/DOS) 2 MG/3ML ~~LOC~~ SOPN: 0.5000 mg | PEN_INJECTOR | SUBCUTANEOUS | 1 refills | Status: DC

## 2023-08-26 ENCOUNTER — Other Ambulatory Visit: Payer: Self-pay | Admitting: Internal Medicine

## 2023-09-08 ENCOUNTER — Telehealth: Payer: Self-pay

## 2023-09-08 NOTE — Telephone Encounter (Signed)
 Contact patient to schedule appointment

## 2023-09-08 NOTE — Telephone Encounter (Signed)
 Lmx1 to schedule follow up with Dr. Lonzo Cloud.

## 2023-09-15 ENCOUNTER — Ambulatory Visit (INDEPENDENT_AMBULATORY_CARE_PROVIDER_SITE_OTHER): Payer: Self-pay | Admitting: Internal Medicine

## 2023-09-15 ENCOUNTER — Encounter: Payer: Self-pay | Admitting: Internal Medicine

## 2023-09-15 VITALS — BP 126/78 | HR 105 | Ht 69.5 in | Wt 195.8 lb

## 2023-09-15 DIAGNOSIS — Z794 Long term (current) use of insulin: Secondary | ICD-10-CM

## 2023-09-15 DIAGNOSIS — E785 Hyperlipidemia, unspecified: Secondary | ICD-10-CM | POA: Diagnosis not present

## 2023-09-15 DIAGNOSIS — E1165 Type 2 diabetes mellitus with hyperglycemia: Secondary | ICD-10-CM

## 2023-09-15 LAB — POCT GLYCOSYLATED HEMOGLOBIN (HGB A1C): Hemoglobin A1C: 12 % — AB (ref 4.0–5.6)

## 2023-09-15 LAB — POCT GLUCOSE (DEVICE FOR HOME USE): Glucose Fasting, POC: 122 mg/dL — AB (ref 70–99)

## 2023-09-15 MED ORDER — SEMAGLUTIDE (1 MG/DOSE) 4 MG/3ML ~~LOC~~ SOPN: 1.0000 mg | PEN_INJECTOR | SUBCUTANEOUS | 6 refills | Status: DC

## 2023-09-15 MED ORDER — TRESIBA FLEXTOUCH 100 UNIT/ML ~~LOC~~ SOPN: 20.0000 [IU] | PEN_INJECTOR | Freq: Every day | SUBCUTANEOUS | 3 refills | Status: AC

## 2023-09-15 MED ORDER — INSULIN PEN NEEDLE 32G X 4 MM MISC
1.0000 | Freq: Every day | 3 refills | Status: AC
Start: 2023-09-15 — End: ?

## 2023-09-15 MED ORDER — DAPAGLIFLOZIN PROPANEDIOL 10 MG PO TABS: 10.0000 mg | ORAL_TABLET | Freq: Every day | ORAL | 2 refills | Status: AC

## 2023-09-15 NOTE — Patient Instructions (Addendum)
-   Continue Farxiga 10 mg daily  - Increase Ozempic 1 mg weekly  - Continue Tresiba 20 units daily     HOW TO TREAT LOW BLOOD SUGARS (Blood sugar LESS THAN 70 MG/DL) Please follow the RULE OF 15 for the treatment of hypoglycemia treatment (when your (blood sugars are less than 70 mg/dL)   STEP 1: Take 15 grams of carbohydrates when your blood sugar is low, which includes:  3-4 GLUCOSE TABS  OR 3-4 OZ OF JUICE OR REGULAR SODA OR ONE TUBE OF GLUCOSE GEL    STEP 2: RECHECK blood sugar in 15 MINUTES STEP 3: If your blood sugar is still low at the 15 minute recheck --> then, go back to STEP 1 and treat AGAIN with another 15 grams of carbohydrates.

## 2023-09-15 NOTE — Progress Notes (Signed)
 Name: Logan Davis  Age/ Sex: 58 y.o., male   MRN/ DOB: [id number], 02-09-1966     PCP: Rosemary Holms   Reason for Endocrinology Evaluation: Type 2 Diabetes Mellitus     Initial Endocrinology Clinic Visit: 09/11/2018    PATIENT IDENTIFIER: [name] is a 58 y.o. male with a past medical history of HTN, dyslipidemia and T2DM. The patient has followed with Endocrinology clinic since 09/11/2018 for consultative assistance with management of his diabetes.  DIABETIC HISTORY:  Logan Davis was diagnosed with T2DM in  2018. He has reported intolerance to Metformin. He has tried [place] in the past without reported intolerance. Insulin has been started shortly after diagnosis.  His hemoglobin A1c has ranged from 8.7% in 2019, peaking at > 15.5% in 2018.   On his initial visit to our clinic his A1c was 13.0 %. He was on Farxiga (but had stopped it a month prior to his presentation),so we stopped it,  he was skipping lantus ~ 3-4 x a week . He was also on Victoza. We attempted to switch Victoza to trulicity but he opted to continue with Victoza.  In 11/2018 we started [place] but he stopped taking it on his own 06/2019    farxiga and trulicity started 11/2018 By 06/2021 was on  farxiga and Metformin only    NPH started 02/2021  Self discontinued Metformin due to nausea 2024  SUBJECTIVE:   During the last visit (01/13/2022): A1c 8.2 %.  We continued  metformin, and farxiga   Today (09/15/2023): Logan Davis is here for a follow up on his diabetes management. He has not been to our clinic in 20 months. He  check glucose x daily  He was not on meds up to two months ago , until he saw his PCP 2 months ago with an A1c 14.0%  Denies nausea, vomiting  Denies constipation or diarrhea    HOME DIABETES REGIMEN:  Metformin 500 mg XR 2 tablets twice daily- not taking  Farxiga 10 mg daily  Ozempic  0.5 mg weekly Tresiba 20 units daily    METER DOWNLOAD SUMMARY: Did not  bring      DIABETIC COMPLICATIONS: Microvascular complications:    Denies: neuropathy, retinopathy Last eye exam: Completed 02/2018   Macrovascular complications:    Denies: CAD, PVD, CVA  HISTORY:  Past Medical History:  Past Medical History:  Diagnosis Date   Cervical radiculopathy    Diabetes mellitus without complication (HCC)    Foot pain, right    Hyperlipidemia    Hypertension    Injury of cervical spine (HCC)    h/o   Lumbar radiculopathy    Plantar fasciitis    Past Surgical History: No past surgical history on file. Social History:  reports that he has never smoked. He has never used smokeless tobacco. He reports that he does not drink alcohol and does not use drugs. Family History:  Family History  Problem Relation Age of Onset   Diabetes Mother    Hypertension Mother    Hypertension Father    Diabetes Father    Diabetes Sister    Hypertension Sister    Hyperlipidemia Sister      HOME MEDICATIONS: Allergies as of 09/15/2023       Reactions   Sulfa Antibiotics Anaphylaxis        Medication List        Accurate as of September 15, 2023  7:50 AM. If you have  any questions, ask your nurse or doctor.          STOP taking these medications    Lantus SoloStar 100 UNIT/ML Solostar Pen Generic drug: insulin glargine Stopped by: Johnney Ou Jazlene Bares   losartan 100 MG tablet Commonly known as: COZAAR Stopped by: Johnney Ou Lounell Schumacher   metFORMIN 500 MG 24 hr tablet Commonly known as: GLUCOPHAGE-XR Stopped by: Johnney Ou Dannelle Rhymes   Semaglutide(0.25 or 0.5MG /DOS) 2 MG/3ML Sopn Replaced by: Semaglutide (1 MG/DOSE) 4 MG/3ML Sopn Stopped by: Johnney Ou Laneya Gasaway       TAKE these medications    amLODipine 10 MG tablet Commonly known as: NORVASC Take by mouth.   atorvastatin 20 MG tablet Commonly known as: LIPITOR Take 1 tablet (20 mg total) by mouth daily.   dapagliflozin propanediol 10 MG Tabs tablet Commonly known as: Farxiga Take  1 tablet (10 mg total) by mouth daily before breakfast.   escitalopram 20 MG tablet Commonly known as: LEXAPRO Take by mouth. What changed: Another medication with the same name was removed. Continue taking this medication, and follow the directions you see here. Changed by: Johnney Ou Tallan Sandoz   FreeStyle Libre 2 Sensor Misc 1 Device by Does not apply route every 14 (fourteen) days.   gabapentin 800 MG tablet Commonly known as: NEURONTIN TAKE 1 TABLET(800 MG) BY MOUTH AT BEDTIME   hydrochlorothiazide 25 MG tablet Commonly known as: HYDRODIURIL Take 1 tablet (25 mg total) by mouth daily.   Insulin Pen Needle 32G X 4 MM Misc 1 Device by Does not apply route daily in the afternoon. What changed: medication strength Changed by: Johnney Ou Jeronda Don   losartan-hydrochlorothiazide 100-12.5 MG tablet Commonly known as: HYZAAR Take 1 tablet by mouth daily.   losartan-hydrochlorothiazide 100-25 MG tablet Commonly known as: HYZAAR Take 1 tablet by mouth daily.   OneTouch Verio test strip Generic drug: glucose blood 1 each by Other route daily in the afternoon. Use as instructed   Semaglutide (1 MG/DOSE) 4 MG/3ML Sopn Inject 1 mg as directed once a week. Replaces: Semaglutide(0.25 or 0.5MG /DOS) 2 MG/3ML Sopn Started by: Scarlette Shorts   Evaristo Bury FlexTouch 100 UNIT/ML FlexTouch Pen Generic drug: insulin degludec Inject 20 Units into the skin daily. Started by: Johnney Ou Valrie Jia        OBJECTIVE:   Vital Signs: BP 126/78 (BP Location: Left Arm, Patient Position: Sitting, Cuff Size: Normal)   Pulse (!) 105   Ht 5' 9.5" (1.765 m)   Wt 195 lb 12.8 oz (88.8 kg)   SpO2 99%   BMI 28.50 kg/m     Exam: General: Pt appears well and is in NAD  Lungs: Clear with good BS bilat   Heart: RRR   Abdomen: Soft, non tender  Extremities: No pretibial edema.   Neuro: MS is good with appropriate affect, pt is alert and Ox3   DM Foot Exam 09/15/2023  The skin of the feet  is intact without sores or ulcerations. The pedal pulses are 2+ on right and 2+ on left. The sensation is intact to a screening 5.07, 10 gram monofilament bilaterally   DATA REVIEWED:  Latest Reference Range & Units 01/11/22 14:22 01/11/22 14:26  POC Glucose 70 - 99 mg/dl 161 !   Hemoglobin A1C 4.0 - 5.6 %  12.5 !   Labs @ careeverywhere  08/05/2023  BUN 26 Cr. 1.69 GFR  47   06/30/2023 Tg 67 HDL 50 LDL 141  Alb/Cr ratio 14  Old records , labs and  images have been reviewed.   ASSESSMENT / PLAN / RECOMMENDATIONS:   1) Type 2 Diabetes Mellitus, poorly controlled without  complications - Most recent A1c of 12.0 %. Goal A1c < 7.0%   -Logan Davis has not been to our clinic in 20 months, he was not taking any of his glycemic agents until 2 months ago when he saw his PCP and his A1c was ~14%.  He started taking insulin, Farxiga, and Ozempic 2 months ago with an A1c of 12.0% today -Patient is interested in having his DMV forms filled but unfortunately he knows he will not pass with such an A1c, unfortunately the patient has recurrent history of this practice. -I will increase Ozempic -He self discontinued metformin due to nausea -He is up-to-date on his labs through PCPs office   MEDICATIONS: Continue Farxiga 10 mg daily  Increase Ozempic 1 mg weekly Continue Tresiba 20 units daily   EDUCATION / INSTRUCTIONS: BG monitoring instructions: Patient is instructed to check his blood sugars 2 times a day, fasting and bedtime Call [place] [facility] if: BG persistently < 70  I reviewed the Rule of 15 for the treatment of hypoglycemia in detail with the patient. Literature supplied.    2) Diabetic complications:  Eye: Does not have known diabetic retinopathy.  Neuro/ Feet: Does not have known diabetic peripheral neuropathy. Renal: Patient does not have known baseline CKD. He is  on an ACEI/ARB at present. Microalbumin     3) Dyslipidemia:  -Patient on atorvastatin 20 mg  daily.    F/U in 2 months    Signed electronically by: Lyndle Herrlich, MD  [facility] Endocrinology  [facility] [address] [place], [place] [address] Phone: [phone] FAX: [phone]   CC: Bryon Lions, PA-C [address] [place] [address] Phone: [phone]  Fax: [phone]  Return to Endocrinology clinic as below: No future appointments.

## 2023-11-07 ENCOUNTER — Other Ambulatory Visit (HOSPITAL_COMMUNITY): Payer: Self-pay

## 2023-11-18 ENCOUNTER — Encounter: Payer: Self-pay | Admitting: Physician Assistant

## 2023-11-25 ENCOUNTER — Ambulatory Visit: Admitting: Internal Medicine

## 2023-12-07 ENCOUNTER — Other Ambulatory Visit (HOSPITAL_COMMUNITY): Payer: Self-pay

## 2023-12-07 ENCOUNTER — Telehealth: Payer: Self-pay | Admitting: Pharmacy Technician

## 2023-12-07 NOTE — Telephone Encounter (Signed)
 Pharmacy Patient Advocate Encounter  Received notification from [facility] that Prior Authorization for Ozempic  (1 MG/DOSE) 4MG /3ML pen-injectors has been APPROVED from 12/07/2023 to 12/06/2024. Ran test claim, Copay is $24.99. This test claim was processed through [employer]- copay amounts may vary at other pharmacies due to pharmacy/plan contracts, or as the patient moves through the different stages of their insurance plan.   PA #/Case ID/Reference #: [id number]

## 2023-12-07 NOTE — Telephone Encounter (Signed)
 Pharmacy Patient Advocate Encounter   Received notification from CoverMyMeds that prior authorization for Ozempic  (1 MG/DOSE) 4MG /3ML pen-injectors is required/requested.   Insurance verification completed.   The patient is insured through [employer] .   Per test claim: PA required; PA submitted to above mentioned insurance via CoverMyMeds Key/confirmation #/EOC [id number] Status is pending

## 2023-12-13 ENCOUNTER — Ambulatory Visit: Admitting: Podiatry

## 2024-01-06 ENCOUNTER — Encounter: Payer: Self-pay | Admitting: Internal Medicine

## 2024-01-06 ENCOUNTER — Ambulatory Visit: Admitting: Internal Medicine

## 2024-01-06 NOTE — Progress Notes (Deleted)
 Name: DEX BLAKELY  Age/ Sex: 58 y.o., male   MRN/ DOB: [id number], 1966/05/18     PCP: Moreira, Niall A, PA-C   Reason for Endocrinology Evaluation: Type 2 Diabetes Mellitus     Initial Endocrinology Clinic Visit: 09/11/2018    PATIENT IDENTIFIER: Logan Davis is a 58 y.o. male with a past medical history of HTN, dyslipidemia and T2DM. The patient has followed with Endocrinology clinic since 09/11/2018 for consultative assistance with management of his diabetes.  DIABETIC HISTORY:  Logan Davis was diagnosed with T2DM in  2018. He has reported intolerance to Metformin . He has tried Farxiga  in the past without reported intolerance. Insulin  has been started shortly after diagnosis.  His hemoglobin A1c has ranged from 8.7% in 2019, peaking at > 15.5% in 2018.   On his initial visit to our clinic his A1c was 13.0 %. He was on Farxiga  (but had stopped it a month prior to his presentation),so we stopped it,  he was skipping lantus  ~ 3-4 x a week . He was also on Victoza. We attempted to switch Victoza to trulicity  but he opted to continue with Victoza.  In 11/2018 we started Farxiga  but he stopped taking it on his own 06/2019    farxiga  and trulicity  started 11/2018 By 06/2021 was on  farxiga  and Metformin  only    NPH started 02/2021  Self discontinued Metformin  due to nausea 2024  SUBJECTIVE:   During the last visit (09/15/2023): A1c 12.0 %  Today (01/06/2024): Logan Davis is here for a follow up on his diabetes management. He  check glucose x daily  He was not on meds up to two months ago , until he saw his PCP 2 months ago with an A1c 14.0%  Denies nausea, vomiting  Denies constipation or diarrhea    HOME DIABETES REGIMEN:  Farxiga  10 mg daily  Ozempic   1 mg weekly Tresiba  20 units daily    METER DOWNLOAD SUMMARY: Did not bring      DIABETIC COMPLICATIONS: Microvascular complications:    Denies: neuropathy, retinopathy Last eye exam: Completed 02/2018    Macrovascular complications:    Denies: CAD, PVD, CVA  HISTORY:  Past Medical History:  Past Medical History:  Diagnosis Date   Cervical radiculopathy    Diabetes mellitus without complication (HCC)    Foot pain, right    Hyperlipidemia    Hypertension    Injury of cervical spine (HCC)    h/o   Lumbar radiculopathy    Plantar fasciitis    Past Surgical History: No past surgical history on file. Social History:  reports that he has never smoked. He has never used smokeless tobacco. He reports that he does not drink alcohol and does not use drugs. Family History:  Family History  Problem Relation Age of Onset   Diabetes Mother    Hypertension Mother    Hypertension Father    Diabetes Father    Diabetes Sister    Hypertension Sister    Hyperlipidemia Sister      HOME MEDICATIONS: Allergies as of 01/06/2024       Reactions   Sulfa Antibiotics Anaphylaxis        Medication List        Accurate as of January 06, 2024  6:44 AM. If you have any questions, ask your nurse or doctor.          amLODipine 10 MG tablet Commonly known as: NORVASC Take by mouth.  atorvastatin  20 MG tablet Commonly known as: LIPITOR Take 1 tablet (20 mg total) by mouth daily.   dapagliflozin  propanediol 10 MG Tabs tablet Commonly known as: Farxiga  Take 1 tablet (10 mg total) by mouth daily before breakfast.   escitalopram 20 MG tablet Commonly known as: LEXAPRO Take by mouth.   FreeStyle Libre 2 Sensor Misc 1 Device by Does not apply route every 14 (fourteen) days.   gabapentin  800 MG tablet Commonly known as: NEURONTIN  TAKE 1 TABLET(800 MG) BY MOUTH AT BEDTIME   hydrochlorothiazide  25 MG tablet Commonly known as: HYDRODIURIL  Take 1 tablet (25 mg total) by mouth daily.   Insulin  Pen Needle 32G X 4 MM Misc 1 Device by Does not apply route daily in the afternoon.   losartan -hydrochlorothiazide  100-12.5 MG tablet Commonly known as: HYZAAR Take 1 tablet by mouth daily.    losartan -hydrochlorothiazide  100-25 MG tablet Commonly known as: HYZAAR Take 1 tablet by mouth daily.   OneTouch Verio test strip Generic drug: glucose blood 1 each by Other route daily in the afternoon. Use as instructed   Semaglutide  (1 MG/DOSE) 4 MG/3ML Sopn Inject 1 mg as directed once a week.   Tresiba  FlexTouch 100 UNIT/ML FlexTouch Pen Generic drug: insulin  degludec Inject 20 Units into the skin daily.        OBJECTIVE:   Vital Signs: There were no vitals taken for this visit.    Exam: General: Pt appears well and is in NAD  Lungs: Clear with good BS bilat   Heart: RRR   Abdomen: Soft, non tender  Extremities: No pretibial edema.   Neuro: MS is good with appropriate affect, pt is alert and Ox3   DM Foot Exam 09/15/2023  The skin of the feet is intact without sores or ulcerations. The pedal pulses are 2+ on right and 2+ on left. The sensation is intact to a screening 5.07, 10 gram monofilament bilaterally   DATA REVIEWED:  Latest Reference Range & Units 01/11/22 14:22 01/11/22 14:26  POC Glucose 70 - 99 mg/dl 703 !   Hemoglobin A1C 4.0 - 5.6 %  12.5 !   Labs @ careeverywhere  08/05/2023  BUN 26 Cr. 1.69 GFR  47   06/30/2023 Tg 67 HDL 50 LDL 141  Alb/Cr ratio 14  Old records , labs and images have been reviewed.   ASSESSMENT / PLAN / RECOMMENDATIONS:   1) Type 2 Diabetes Mellitus, poorly controlled without  complications - Most recent A1c of 12.0 %. Goal A1c < 7.0%   -Logan Davis has not been to our clinic in 20 months, he was not taking any of his glycemic agents until 2 months ago when he saw his PCP and his A1c was ~14%.  He started taking insulin , Farxiga , and Ozempic  2 months ago with an A1c of 12.0% today -Patient is interested in having his DMV forms filled but unfortunately he knows he will not pass with such an A1c, unfortunately the patient has recurrent history of this practice. -I will increase Ozempic  -He self discontinued metformin   due to nausea -He is up-to-date on his labs through PCPs office   MEDICATIONS: Continue Farxiga  10 mg daily  Increase Ozempic  1 mg weekly Continue Tresiba  20 units daily   EDUCATION / INSTRUCTIONS: BG monitoring instructions: Patient is instructed to check his blood sugars 2 times a day, fasting and bedtime Call [facility] if: BG persistently < 70  I reviewed the Rule of 15 for the treatment of hypoglycemia in detail with  the patient. Literature supplied.    2) Diabetic complications:  Eye: Does not have known diabetic retinopathy.  Neuro/ Feet: Does not have known diabetic peripheral neuropathy. Renal: Patient does not have known baseline CKD. He is  on an ACEI/ARB at present. Microalbumin     3) Dyslipidemia:  -Patient on atorvastatin  20 mg daily.    F/U in 2 months    Signed electronically by: Stefano Redgie Butts, MD  [facility] Endocrin[facility]  [facility] [address] [place], [place] [address] Phone: [phone] FAX: [phone]   CC: Valma Lannie LABOR, PA-C [address] [place] [address] Phone: [phone]  Fax: [phone]  Return to Endocrinology clinic as below: Future Appointments  Date Time Provider Department Center  01/06/2024  7:50 AM E[place]ka Valdes, Donell Redgie, MD [facility] None

## 2024-01-19 ENCOUNTER — Ambulatory Visit: Admitting: Internal Medicine

## 2024-02-01 ENCOUNTER — Ambulatory Visit: Admitting: Internal Medicine

## 2024-03-06 ENCOUNTER — Other Ambulatory Visit: Payer: Self-pay

## 2024-03-06 MED ORDER — OZEMPIC (0.25 OR 0.5 MG/DOSE) 2 MG/3ML ~~LOC~~ SOPN: 0.2500 mg | PEN_INJECTOR | SUBCUTANEOUS | Status: AC

## 2024-03-06 NOTE — Telephone Encounter (Signed)
 Patient will come by and pick up a sample of Ozempic . Patient is aware that this is a smaller dose than he is currently on. He will take the 0.25mg  until he can pick up his 1mg  dose with insurance. Patient left medication in a truck at work and can't refill until October.

## 2024-03-07 NOTE — Telephone Encounter (Signed)
Patient picked up Ozempic sample 

## 2024-03-27 ENCOUNTER — Other Ambulatory Visit: Payer: Self-pay

## 2024-03-27 ENCOUNTER — Telehealth: Payer: Self-pay

## 2024-03-27 MED ORDER — SEMAGLUTIDE (1 MG/DOSE) 4 MG/3ML ~~LOC~~ SOPN: 1.0000 mg | PEN_INJECTOR | SUBCUTANEOUS | 0 refills | Status: DC

## 2024-03-27 NOTE — Telephone Encounter (Signed)
 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

## 2024-03-29 ENCOUNTER — Ambulatory Visit: Admitting: Internal Medicine

## 2024-04-06 ENCOUNTER — Other Ambulatory Visit: Payer: Self-pay

## 2024-04-06 MED ORDER — SEMAGLUTIDE (1 MG/DOSE) 4 MG/3ML ~~LOC~~ SOPN: 1.0000 mg | PEN_INJECTOR | SUBCUTANEOUS | 0 refills | Status: AC

## 2024-04-06 NOTE — Telephone Encounter (Signed)
 Patient request Ozempic  to be sent to [place] . 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

## 2024-04-10 ENCOUNTER — Telehealth: Payer: Self-pay

## 2024-04-10 NOTE — Telephone Encounter (Signed)
 Spoke with the [facility] in [place] and they were able to back the prescription out. Patient should be able to have the [facility] in [place] to pull the prescription and process.  Co-pay for patient will be $985 dollars for the Ozempic 

## 2024-04-26 ENCOUNTER — Telehealth: Payer: Self-pay | Admitting: Internal Medicine

## 2024-04-26 NOTE — Telephone Encounter (Signed)
 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 I called patient, verified 2 identifiers. Advised patient I was calling him back and he advised nevermind he is going to be up here today anyway

## 2024-05-21 ENCOUNTER — Ambulatory Visit: Admitting: Internal Medicine

## 2024-05-21 ENCOUNTER — Encounter: Payer: Self-pay | Admitting: Internal Medicine

## 2024-05-21 NOTE — Progress Notes (Deleted)
 Name: Logan Davis  Age/ Sex: 58 y.o., male   MRN/ DOB: [id number], 1966/03/28     PCP: Moreira, Niall A, PA-C   Reason for Endocrinology Evaluation: Type 2 Diabetes Mellitus     Initial Endocrinology Clinic Visit: 09/11/2018    PATIENT IDENTIFIER: Logan Davis is a 58 y.o. male with a past medical history of HTN, dyslipidemia and T2DM. The patient has followed with Endocrinology clinic since 09/11/2018 for consultative assistance with management of his diabetes.  DIABETIC HISTORY:  Logan Davis was diagnosed with T2DM in  2018. He has reported intolerance to Metformin . He has tried Farxiga  in the past without reported intolerance. Insulin  has been started shortly after diagnosis.  His hemoglobin A1c has ranged from 8.7% in 2019, peaking at > 15.5% in 2018.   On his initial visit to our clinic his A1c was 13.0 %. He was on Farxiga  (but had stopped it a month prior to his presentation),so we stopped it,  he was skipping lantus  ~ 3-4 x a week . He was also on Victoza. We attempted to switch Victoza to trulicity  but he opted to continue with Victoza.  In 11/2018 we started Farxiga  but he stopped taking it on his own 06/2019    farxiga  and trulicity  started 11/2018 By 06/2021 was on  farxiga  and Metformin  only    NPH started 02/2021  Self discontinued Metformin  due to nausea 2024  SUBJECTIVE:   During the last visit (09/15/2023): A1c 12.0%.   Today (05/21/2024): Logan Davis is here for a follow up on his diabetes management  Denies nausea, vomiting  Denies constipation or diarrhea    HOME DIABETES REGIMEN:  Metformin  500 mg XR 2 tablets twice daily- not taking  Farxiga  10 mg daily  Ozempic   1 mg weekly Tresiba  20 units daily    METER DOWNLOAD SUMMARY: Did not bring      DIABETIC COMPLICATIONS: Microvascular complications:    Denies: neuropathy, retinopathy Last eye exam: Completed 02/2018   Macrovascular complications:    Denies: CAD, PVD, CVA  HISTORY:   Past Medical History:  Past Medical History:  Diagnosis Date   Cervical radiculopathy    Diabetes mellitus without complication (HCC)    Foot pain, right    Hyperlipidemia    Hypertension    Injury of cervical spine (HCC)    h/o   Lumbar radiculopathy    Plantar fasciitis    Past Surgical History: No past surgical history on file. Social History:  reports that he has never smoked. He has never used smokeless tobacco. He reports that he does not drink alcohol and does not use drugs. Family History:  Family History  Problem Relation Age of Onset   Diabetes Mother    Hypertension Mother    Hypertension Father    Diabetes Father    Diabetes Sister    Hypertension Sister    Hyperlipidemia Sister      HOME MEDICATIONS: Allergies as of 05/21/2024       Reactions   Sulfa Antibiotics Anaphylaxis        Medication List        Accurate as of May 21, 2024  7:40 AM. If you have any questions, ask your nurse or doctor.          amLODipine 10 MG tablet Commonly known as: NORVASC Take by mouth.   atorvastatin  20 MG tablet Commonly known as: LIPITOR Take 1 tablet (20 mg total) by mouth daily.  dapagliflozin  propanediol 10 MG Tabs tablet Commonly known as: Farxiga  Take 1 tablet (10 mg total) by mouth daily before breakfast.   escitalopram 20 MG tablet Commonly known as: LEXAPRO Take by mouth.   FreeStyle Libre 2 Sensor Misc 1 Device by Does not apply route every 14 (fourteen) days.   gabapentin  800 MG tablet Commonly known as: NEURONTIN  TAKE 1 TABLET(800 MG) BY MOUTH AT BEDTIME   hydrochlorothiazide  25 MG tablet Commonly known as: HYDRODIURIL  Take 1 tablet (25 mg total) by mouth daily.   Insulin  Pen Needle 32G X 4 MM Misc 1 Device by Does not apply route daily in the afternoon.   losartan -hydrochlorothiazide  100-12.5 MG tablet Commonly known as: HYZAAR Take 1 tablet by mouth daily.   losartan -hydrochlorothiazide  100-25 MG tablet Commonly known  as: HYZAAR Take 1 tablet by mouth daily.   OneTouch Verio test strip Generic drug: glucose blood 1 each by Other route daily in the afternoon. Use as instructed   Ozempic  (0.25 or 0.5 MG/DOSE) 2 MG/3ML Sopn Generic drug: Semaglutide (0.25 or 0.5MG /DOS) Inject 0.25 mg into the skin once a week.   Semaglutide  (1 MG/DOSE) 4 MG/3ML Sopn Inject 1 mg as directed once a week.   Tresiba  FlexTouch 100 UNIT/ML FlexTouch Pen Generic drug: insulin  degludec Inject 20 Units into the skin daily.        OBJECTIVE:   Vital Signs: There were no vitals taken for this visit.    Exam: General: Pt appears well and is in NAD  Lungs: Clear with good BS bilat   Heart: RRR   Abdomen: Soft, non tender  Extremities: No pretibial edema.   Neuro: MS is good with appropriate affect, pt is alert and Ox3   DM Foot Exam 09/15/2023  The skin of the feet is intact without sores or ulcerations. The pedal pulses are 2+ on right and 2+ on left. The sensation is intact to a screening 5.07, 10 gram monofilament bilaterally   DATA REVIEWED:  Latest Reference Range & Units 01/11/22 14:22 01/11/22 14:26  POC Glucose 70 - 99 mg/dl 703 !   Hemoglobin A1C 4.0 - 5.6 %  12.5 !   Labs @ careeverywhere  08/05/2023  BUN 26 Cr. 1.69 GFR  47   06/30/2023 Tg 67 HDL 50 LDL 141  Alb/Cr ratio 14  Old records , labs and images have been reviewed.   ASSESSMENT / PLAN / RECOMMENDATIONS:   1) Type 2 Diabetes Mellitus, poorly controlled without  complications - Most recent A1c of 12.0 %. Goal A1c < 7.0%   -Logan Davis has not been to our clinic in 20 months, he was not taking any of his glycemic agents until 2 months ago when he saw his PCP and his A1c was ~14%.  He started taking insulin , Farxiga , and Ozempic  2 months ago with an A1c of 12.0% today -Patient is interested in having his DMV forms filled but unfortunately he knows he will not pass with such an A1c, unfortunately the patient has recurrent history of  this practice. -I will increase Ozempic  -He self discontinued metformin  due to nausea -He is up-to-date on his labs through PCPs office   MEDICATIONS: Continue Farxiga  10 mg daily  Increase Ozempic  1 mg weekly Continue Tresiba  20 units daily   EDUCATION / INSTRUCTIONS: BG monitoring instructions: Patient is instructed to check his blood sugars 2 times a day, fasting and bedtime Call [facility] if: BG persistently < 70  I reviewed the Rule of 15 for the treatment  of hypoglycemia in detail with the patient. Literature supplied.    2) Diabetic complications:  Eye: Does not have known diabetic retinopathy.  Neuro/ Feet: Does not have known diabetic peripheral neuropathy. Renal: Patient does not have known baseline CKD. He is  on an ACEI/ARB at present. Microalbumin     3) Dyslipidemia:  -Patient on atorvastatin  20 mg daily.    F/U in 2 months    Signed electronically by: Stefano Redgie Butts, MD  [facility] Endocrinology  [facility] [address] [place], [place] [address] Phone: [phone] FAX: [phone]   CC: Valma Lannie LABOR, PA-C [address] [place] [address] Phone: [phone]  Fax: [phone]  Return to Endocrinology clinic as below: Future Appointments  Date Time Provider Department Center  05/21/2024  1:40 PM Shunna Mikaelian, Donell Redgie, MD [facility] None
